# Patient Record
Sex: Male | Born: 1990
Health system: Southern US, Community
[De-identification: ages and names within clinical notes are randomized; demographics above are authoritative.]

## PROBLEM LIST (undated history)

## (undated) DIAGNOSIS — G4733 Obstructive sleep apnea (adult) (pediatric): Secondary | ICD-10-CM

## (undated) DIAGNOSIS — I5033 Acute on chronic diastolic (congestive) heart failure: Secondary | ICD-10-CM

## (undated) DIAGNOSIS — R1110 Vomiting, unspecified: Secondary | ICD-10-CM

## (undated) DIAGNOSIS — I4729 Other ventricular tachycardia: Secondary | ICD-10-CM

## (undated) DIAGNOSIS — R000 Tachycardia, unspecified: Secondary | ICD-10-CM

## (undated) HISTORY — DX: Other ventricular tachycardia: I47.29

## (undated) HISTORY — PX: OTHER SURGICAL HISTORY: SHX169

## (undated) HISTORY — DX: Acute on chronic diastolic (congestive) heart failure: I50.33

## (undated) HISTORY — DX: Tachycardia, unspecified: R00.0

---

## 2019-08-31 ENCOUNTER — Other Ambulatory Visit: Payer: Self-pay | Admitting: Nurse Practitioner

## 2019-08-31 DIAGNOSIS — E66813 Obesity, class 3: Secondary | ICD-10-CM

## 2019-08-31 DIAGNOSIS — Z6841 Body Mass Index (BMI) 40.0 and over, adult: Secondary | ICD-10-CM

## 2019-08-31 DIAGNOSIS — U071 COVID-19: Secondary | ICD-10-CM

## 2019-08-31 NOTE — Progress Notes (Signed)
  I connected by phone with Zachary Fletcher on 08/31/2019 at 2:43 PM to discuss the potential use of an new treatment for mild to moderate COVID-19 viral infection in non-hospitalized patients.  This patient is a 29 y.o. male that meets the FDA criteria for Emergency Use Authorization of bamlanivimab/etesevimab or casirivimab/imdevimab.  Has a (+) direct SARS-CoV-2 viral test result  Has mild or moderate COVID-19   Is NOT hospitalized due to COVID-19  Is within 10 days of symptom onset  Has at least one of the high risk factor(s) for progression to severe COVID-19 and/or hospitalization as defined in EUA.  Specific high risk criteria : BMI > 25   I have spoken and communicated the following to the patient or parent/caregiver:  1. FDA has authorized the emergency use of bamlanivimab/etesevimab and casirivimab\imdevimab for the treatment of mild to moderate COVID-19 in adults and pediatric patients with positive results of direct SARS-CoV-2 viral testing who are 46 years of age and older weighing at least 40 kg, and who are at high risk for progressing to severe COVID-19 and/or hospitalization.  2. The significant known and potential risks and benefits of bamlanivimab/etesevimab and casirivimab\imdevimab, and the extent to which such potential risks and benefits are unknown.  3. Information on available alternative treatments and the risks and benefits of those alternatives, including clinical trials.  4. Patients treated with bamlanivimab/etesevimab and casirivimab\imdevimab should continue to self-isolate and use infection control measures (e.g., wear mask, isolate, social distance, avoid sharing personal items, clean and disinfect "high touch" surfaces, and frequent handwashing) according to CDC guidelines.   5. The patient or parent/caregiver has the option to accept or refuse bamlanivimab/etesevimab or casirivimab\imdevimab .  After reviewing this information with the patient, The  patient agreed to proceed with receiving the bamlanimivab infusion and will be provided a copy of the Fact sheet prior to receiving the infusion.[name] 08/31/2019 2:43 PM

## 2019-09-01 ENCOUNTER — Ambulatory Visit (HOSPITAL_COMMUNITY)
Admission: RE | Admit: 2019-09-01 | Discharge: 2019-09-01 | Disposition: A | Discharge status: Home / Self Care | Payer: HRSA Program | Source: Ambulatory Visit | Attending: Pulmonary Disease | Admitting: Pulmonary Disease

## 2019-09-01 DIAGNOSIS — U071 COVID-19: Secondary | ICD-10-CM | POA: Diagnosis present

## 2019-09-01 DIAGNOSIS — Z6841 Body Mass Index (BMI) 40.0 and over, adult: Secondary | ICD-10-CM | POA: Insufficient documentation

## 2019-09-01 MED ORDER — SODIUM CHLORIDE 0.9 % IV SOLN
Freq: Once | INTRAVENOUS | Status: AC
  Filled 2019-09-01: qty 20

## 2019-09-01 MED ORDER — EPINEPHRINE 0.3 MG/0.3ML IJ SOAJ: 0.3000 mg | Freq: Once | INTRAMUSCULAR | Status: DC | PRN

## 2019-09-01 MED ORDER — METHYLPREDNISOLONE SODIUM SUCC 125 MG IJ SOLR: 125.0000 mg | Freq: Once | INTRAMUSCULAR | Status: DC | PRN

## 2019-09-01 MED ORDER — SODIUM CHLORIDE 0.9 % IV SOLN: INTRAVENOUS | Status: DC | PRN

## 2019-09-01 MED ORDER — ALBUTEROL SULFATE HFA 108 (90 BASE) MCG/ACT IN AERS: 2.0000 | INHALATION_SPRAY | Freq: Once | RESPIRATORY_TRACT | Status: DC | PRN

## 2019-09-01 MED ORDER — FAMOTIDINE IN NACL 20-0.9 MG/50ML-% IV SOLN: 20.0000 mg | Freq: Once | INTRAVENOUS | Status: DC | PRN

## 2019-09-01 MED ORDER — DIPHENHYDRAMINE HCL 50 MG/ML IJ SOLN: 50.0000 mg | Freq: Once | INTRAMUSCULAR | Status: DC | PRN

## 2019-09-01 NOTE — Discharge Instructions (Signed)
10 Things You Can Do to Manage Your COVID-19 Symptoms at Home If you have possible or confirmed COVID-19: 1. Stay home from work and school. And stay away from other public places. If you must go out, avoid using any kind of public transportation, ridesharing, or taxis. 2. Monitor your symptoms carefully. If your symptoms get worse, call your healthcare provider immediately. 3. Get rest and stay hydrated. 4. If you have a medical appointment, call the healthcare provider ahead of time and tell them that you have or may have COVID-19. 5. For medical emergencies, call 911 and notify the dispatch personnel that you have or may have COVID-19. 6. Cover your cough and sneezes with a tissue or use the inside of your elbow. 7. Wash your hands often with soap and water for at least 20 seconds or clean your hands with an alcohol-based hand sanitizer that contains at least 60% alcohol. 8. As much as possible, stay in a specific room and away from other people in your home. Also, you should use a separate bathroom, if available. If you need to be around other people in or outside of the home, wear a mask. 9. Avoid sharing personal items with other people in your household, like dishes, towels, and bedding. 10. Clean all surfaces that are touched often, like counters, tabletops, and doorknobs. Use household cleaning sprays or wipes according to the label instructions. cdc.gov/coronavirus 09/08/2018 This information is not intended to replace advice given to you by your health care provider. Make sure you discuss any questions you have with your health care provider. Document Revised: 02/10/2019 Document Reviewed: 02/10/2019 Elsevier Patient Education  2020 Elsevier Inc.  

## 2019-09-01 NOTE — Progress Notes (Addendum)
°  Diagnosis: COVID-19  Physician: Dr. Joya Gaskins  Procedure: [facility] Med: bamlanivimab\etesevimab infusion - Provided patient with bamlanimivab\etesevimab fact sheet for patients, parents and caregivers prior to infusion.  Complications: No immediate complications noted.  Discharge: Discharged home   Zachary Fletcher 09/01/2019

## 2019-11-29 ENCOUNTER — Emergency Department (INDEPENDENT_AMBULATORY_CARE_PROVIDER_SITE_OTHER): Payer: 59

## 2019-11-29 ENCOUNTER — Other Ambulatory Visit: Payer: Self-pay

## 2019-11-29 ENCOUNTER — Emergency Department
Admission: EM | Admit: 2019-11-29 | Discharge: 2019-11-29 | Disposition: A | Discharge status: Home / Self Care | Payer: 59 | Source: Home / Self Care | Attending: Family Medicine | Admitting: Family Medicine

## 2019-11-29 DIAGNOSIS — R05 Cough: Secondary | ICD-10-CM | POA: Diagnosis not present

## 2019-11-29 DIAGNOSIS — R002 Palpitations: Secondary | ICD-10-CM | POA: Diagnosis not present

## 2019-11-29 DIAGNOSIS — R0600 Dyspnea, unspecified: Secondary | ICD-10-CM | POA: Diagnosis not present

## 2019-11-29 DIAGNOSIS — R053 Chronic cough: Secondary | ICD-10-CM

## 2019-11-29 DIAGNOSIS — I4581 Long QT syndrome: Secondary | ICD-10-CM

## 2019-11-29 DIAGNOSIS — R0609 Other forms of dyspnea: Secondary | ICD-10-CM

## 2019-11-29 DIAGNOSIS — R030 Elevated blood-pressure reading, without diagnosis of hypertension: Secondary | ICD-10-CM

## 2019-11-29 HISTORY — DX: Obstructive sleep apnea (adult) (pediatric): G47.33

## 2019-11-29 LAB — POCT URINALYSIS DIP (MANUAL ENTRY)
Bilirubin, UA: NEGATIVE
Blood, UA: NEGATIVE
Glucose, UA: NEGATIVE mg/dL
Ketones, POC UA: NEGATIVE mg/dL
Leukocytes, UA: NEGATIVE
Nitrite, UA: NEGATIVE
Protein Ur, POC: NEGATIVE mg/dL
Spec Grav, UA: 1.02 (ref 1.010–1.025)
Urobilinogen, UA: 0.2 E.U./dL
pH, UA: 6 (ref 5.0–8.0)

## 2019-11-29 LAB — COMPLETE METABOLIC PANEL WITH GFR
AG Ratio: 1.2 (calc) (ref 1.0–2.5)
ALT: 18 U/L (ref 9–46)
AST: 14 U/L (ref 10–40)
Albumin: 3.7 g/dL (ref 3.6–5.1)
Alkaline phosphatase (APISO): 61 U/L (ref 36–130)
BUN: 12 mg/dL (ref 7–25)
CO2: 28 mmol/L (ref 20–32)
Calcium: 9 mg/dL (ref 8.6–10.3)
Chloride: 102 mmol/L (ref 98–110)
Creat: 0.94 mg/dL (ref 0.60–1.35)
GFR, Est African American: 126 mL/min/{1.73_m2} (ref >=60)
GFR, Est Non African American: 109 mL/min/{1.73_m2} (ref >=60)
Globulin: 3 g/dL (calc) (ref 1.9–3.7)
Glucose, Bld: 94 mg/dL (ref 65–99)
Potassium: 4.2 mmol/L (ref 3.5–5.3)
Sodium: 139 mmol/L (ref 135–146)
Total Bilirubin: 0.7 mg/dL (ref 0.2–1.2)
Total Protein: 6.7 g/dL (ref 6.1–8.1)

## 2019-11-29 LAB — CBC WITH DIFFERENTIAL/PLATELET
Absolute Monocytes: 636 cells/uL (ref 200–950)
Basophils Absolute: 26 cells/uL (ref 0–200)
Basophils Relative: 0.3 %
Eosinophils Absolute: 69 cells/uL (ref 15–500)
Eosinophils Relative: 0.8 %
HCT: 44.5 % (ref 38.5–50.0)
Hemoglobin: 15.2 g/dL (ref 13.2–17.1)
Lymphs Abs: 1634 cells/uL (ref 850–3900)
MCH: 29.9 pg (ref 27.0–33.0)
MCHC: 34.2 g/dL (ref 32.0–36.0)
MCV: 87.4 fL (ref 80.0–100.0)
MPV: 11.6 fL (ref 7.5–12.5)
Monocytes Relative: 7.4 %
Neutro Abs: 6235 cells/uL (ref 1500–7800)
Neutrophils Relative %: 72.5 %
Platelets: 208 10*3/uL (ref 140–400)
RBC: 5.09 10*6/uL (ref 4.20–5.80)
RDW: 13.2 % (ref 11.0–15.0)
Total Lymphocyte: 19 %
WBC: 8.6 10*3/uL (ref 3.8–10.8)

## 2019-11-29 NOTE — ED Provider Notes (Signed)
[facility]    CSN: [id number] Arrival date & time: 11/29/19  1324      History   Chief Complaint Chief Complaint  Patient presents with  . Cough    w/ wheezing  . Palpitations    HPI Zachary Fletcher is a 29 y.o. male.   Patient presents with several complaints: 1)  He has had intermittent recurring palpitations, mostly at night, and feels light-headed when they occur.  The palpitations last about 20 to 30 seconds, resolving spontaneously.  He denies chest pain. 2)  He has had a persistent cough for at least 6 months.  He underwent a polysomnogram on 09/14/19, and consequently was started on Bipap for sleep apnea.   He has difficulty sleeping with the Bipap.   He admits that he has gained weight during the past year, from 380 pounds to 437 pounds. 3)  He reports that his job is stressful, resulting in anxiety that seems to make his symptoms worse.  His symptoms decrease when he is away from work.  The history is provided by the patient.    Past Medical History:  Diagnosis Date  . Sleep apnea, obstructive     There are no problems to display for this patient.   History reviewed. No pertinent surgical history.     Home Medications    Prior to Admission medications   Not on File    Family History Family History  Problem Relation Age of Onset  . Sleep apnea Father   . Diabetes Other     Social History Social History   Tobacco Use  . Smoking status: Never Smoker  . Smokeless tobacco: Never Used  Vaping Use  . Vaping Use: Never used  Substance Use Topics  . Alcohol use: Not Currently  . Drug use: Not on file     Allergies   Patient has no known allergies.   Review of Systems Review of Systems No sore throat + cough + light-headedness during cough No pleuritic pain No chest pain + palpitations, worse at night No wheezing No nasal congestion No post-nasal drainage No sinus pain/pressure No itchy/red eyes No earache No  hemoptysis + SOB No fever/chills No nausea No vomiting No abdominal pain No diarrhea No urinary symptoms No skin rash + fatigue No myalgias + anxiety + headache Used OTC meds without relief   Physical Exam Triage Vital Signs ED Triage Vitals  Enc Vitals Group     BP 11/29/19 0906 (!) 169/104     Pulse Rate 11/29/19 0906 93     Resp 11/29/19 0906 19     Temp 11/29/19 0906 98.1 F (36.7 C)     Temp Source 11/29/19 0906 Oral     SpO2 11/29/19 0906 98 %     Weight 11/29/19 0909 (!) 437 lb 8 oz (198.4 kg)     Height 11/29/19 0909 6\' 2"  (1.88 m)     Head Circumference --      Peak Flow --      Pain Score 11/29/19 0908 0     Pain Loc --      Pain Edu? --      Excl. in [place]? --    No data found.  Updated Vital Signs BP (!) 142/87 (BP Location: Left Arm)   Pulse 93   Temp 98.1 F (36.7 C) (Oral)   Resp 19   Ht 6\' 2"  (1.88 m)   Wt (!) 198.4 kg   SpO2 98%   BMI  56.17 kg/m   Visual Acuity Right Eye Distance:   Left Eye Distance:   Bilateral Distance:    Right Eye Near:   Left Eye Near:    Bilateral Near:     Physical Exam Nursing notes and Vital Signs reviewed. Appearance:  Patient appears stated age, alert and oriented, and in no acute distress.  He is morbidly obese. Eyes:  Pupils are equal, round, and reactive to light and accomodation.  Extraocular movement is intact.  Conjunctivae are not inflamed.  Fundi benign.  Ears:  Canals normal.  Tympanic membranes normal.  Nose:  Normal turbinates.  No sinus tenderness.  Neck:  Supple.  No adenopathy or thyromegaly.  Carotids have normal upstrokes.   Lungs:  Clear to auscultation.  Breath sounds are equal.  Moving air well. Heart:  Regular rate and rhythm without murmurs, rubs, or gallops.  Abdomen:  Protuberant, nontender without masses or hepatosplenomegaly.  Bowel sounds are present.  No CVA or flank tenderness.  Extremities:  No edema.  Skin:  No rash present.   UC Treatments / Results  Labs (all labs  ordered are listed, but only abnormal results are displayed) Labs Reviewed  CBC WITH DIFFERENTIAL/PLATELET  COMPLETE METABOLIC PANEL WITH GFR  POCT URINALYSIS DIP (MANUAL ENTRY)  Ref Range & Units 3 d ago  Color, UA yellow yellow   Clarity, UA clear clear   Glucose, UA negative mg/dL negative   Bilirubin, UA negative negative   Ketones, POC UA negative mg/dL negative   Spec Grav, UA 1.010 - 1.025 1.020   Blood, UA negative negative   pH, UA 5.0 - 8.0 6.0   Protein Ur, POC negative mg/dL negative   Urobilinogen, UA 0.2 or 1.0 E.U./dL 0.2   Nitrite, UA Negative Negative   Leukocytes, UA Negative Negative   Resulting Agency  [facility]      Specimen Collected: 11/29/19 10:17 Last Resulted: 11/29/19 10:18           EKG  Rate:  86 BPM PR:  152 msec QT:  404 msec QTcH:  483 msec QRSD:  98 msec QRS axis:  104 degrees Interpretation:  Prolonged QT; normal sinus rhythm;  no acute changes  Radiology No results found.  Procedures Procedures (including critical care time)  Medications Ordered in UC Medications - No data to display  Initial Impression / Assessment and Plan / UC Course  I have reviewed the triage vital signs and the nursing notes.  Pertinent labs & imaging results that were available during my care of the patient were reviewed by me and considered in my medical decision making (see chart for details).    CBC, CMP pending.  POC urinalysis negative for proteinuria. Suspect GERD contributing to his cough. Recommend cardiologist evaluation as soon as possible for long QT syndrome.  Followup with Family Doctor for evaluation/treatment of multiple medical problems.    Final Clinical Impressions(s) / UC Diagnoses   Final diagnoses:  Palpitations  Dyspnea on exertion  Obesity, morbid (HCC)  Chronic cough  Elevated blood pressure reading without diagnosis of hypertension  Long Q-T syndrome     Discharge Instructions     Minimize strenuous activities and  stress If symptoms become significantly worse during the night or over the weekend, proceed to the local emergency room.     ED Prescriptions    None        Kandra Nicolas, MD 12/02/19 1517

## 2019-11-29 NOTE — ED Triage Notes (Signed)
Pt c/o intermittent heart palpitations, issues sleeping x 3 mos. Recently got new bipap machine for obstructive sleep apnea.

## 2019-11-29 NOTE — Discharge Instructions (Addendum)
Minimize strenuous activities and stress

## 2019-12-23 NOTE — Progress Notes (Signed)
Zachary Fendt, MD Reason for referral-palpitations  HPI: 29 year old male for evaluation of palpitations at request of Theone Murdoch MD.  Patient seen in the emergency room November 29, 2019 with complaints of palpitations and cough.  Chest x-ray showed mild cardiac enlargement.  Potassium 4.2 and hemoglobin 15.2.  QT interval also felt to be long and cardiology asked to evaluate.  Over the past 3 to 6 months patient has had intermittent palpitations described as sudden onset of heart racing.  Last several minutes and resolve spontaneously.  There is an associated mild dyspneic feeling.  No chest pain or syncope.  He otherwise has some dyspnea on exertion but no orthopnea, PND, pedal edema, exertional chest pain or history of syncope.  Cardiology now asked to evaluate.  No current outpatient medications on file.   No current facility-administered medications for this visit.    No Known Allergies   Past Medical History:  Diagnosis Date  . Sleep apnea, obstructive     Past Surgical History:  Procedure Laterality Date  . No prior surgery      Social History   Socioeconomic History  . Marital status: Married    Spouse name: Not on file  . Number of children: Not on file  . Years of education: Not on file  . Highest education level: Not on file  Occupational History  . Not on file  Tobacco Use  . Smoking status: Never Smoker  . Smokeless tobacco: Never Used  Vaping Use  . Vaping Use: Never used  Substance and Sexual Activity  . Alcohol use: Yes    Comment: Rare  . Drug use: Not on file  . Sexual activity: Not on file  Other Topics Concern  . Not on file  Social History Narrative  . Not on file   Social Determinants of Health   Financial Resource Strain:   . Difficulty of Paying Living Expenses: Not on file  Food Insecurity:   . Worried About Charity fundraiser in the Last Year: Not on file  . Ran Out of Food in the Last Year: Not on file    Transportation Needs:   . Lack of Transportation (Medical): Not on file  . Lack of Transportation (Non-Medical): Not on file  Physical Activity:   . Days of Exercise per Week: Not on file  . Minutes of Exercise per Session: Not on file  Stress:   . Feeling of Stress : Not on file  Social Connections:   . Frequency of Communication with Friends and Family: Not on file  . Frequency of Social Gatherings with Friends and Family: Not on file  . Attends Religious Services: Not on file  . Active Member of Clubs or Organizations: Not on file  . Attends Archivist Meetings: Not on file  . Marital Status: Not on file  Intimate Partner Violence:   . Fear of Current or Ex-Partner: Not on file  . Emotionally Abused: Not on file  . Physically Abused: Not on file  . Sexually Abused: Not on file    Family History  Problem Relation Age of Onset  . Sleep apnea Father   . Diabetes Other     ROS: no fevers or chills, productive cough, hemoptysis, dysphasia, odynophagia, melena, hematochezia, dysuria, hematuria, rash, seizure activity, orthopnea, PND, pedal edema, claudication. Remaining systems are negative.  Physical Exam:   Blood pressure 140/80, pulse 84, height 6\' 2"  (1.88 m), weight (!) 444 lb (201.4 kg).  General:  Well developed/morbidly obese in NAD Skin warm/dry Patient not depressed No peripheral clubbing Back-normal HEENT-normal/normal eyelids Neck supple/normal carotid upstroke bilaterally; no bruits; no JVD; no thyromegaly chest - CTA/ normal expansion CV - RRR/normal S1 and S2; no murmurs, rubs or gallops;  PMI nondisplaced Abdomen -NT/ND, no HSM, no mass, + bowel sounds, no bruit 2+ femoral pulses, no bruits Ext-trace edema, no chords, 2+ DP Neuro-grossly nonfocal  ECG -November 29, 2019-sinus rhythm, right axis deviation, right bundle branch block, prolonged QT interval.  Personally reviewed  A/P  1 palpitations-patient sounds potentially to have a  supraventricular tachycardia.  We will schedule an echocardiogram to assess LV function.  Schedule event monitor to further assess.  Further recommendations based on these results.  2 abnormal electrocardiogram-plan echocardiogram as outlined above.  3 morbid obesity-we discussed the importance of diet, exercise and weight loss.  4 obstructive sleep apnea-continue CPAP.  Kirk Ruths, MD

## 2019-12-28 ENCOUNTER — Ambulatory Visit (INDEPENDENT_AMBULATORY_CARE_PROVIDER_SITE_OTHER): Payer: 59 | Admitting: Cardiology

## 2019-12-28 ENCOUNTER — Other Ambulatory Visit: Payer: Self-pay

## 2019-12-28 ENCOUNTER — Encounter: Payer: Self-pay | Admitting: Cardiology

## 2019-12-28 ENCOUNTER — Telehealth: Payer: Self-pay | Admitting: Radiology

## 2019-12-28 VITALS — BP 140/80 | HR 84 | Ht 74.0 in | Wt >= 6400 oz

## 2019-12-28 DIAGNOSIS — R002 Palpitations: Secondary | ICD-10-CM

## 2019-12-28 NOTE — Telephone Encounter (Signed)
Enrolled patient for a 30 day [employer] Event monitor to be mailed to patients home.  

## 2019-12-28 NOTE — Patient Instructions (Signed)
  Testing/Procedures:  Your physician has requested that you have an echocardiogram. Echocardiography is a painless test that uses sound waves to create images of your heart. It provides your doctor with information about the size and shape of your heart and how well your heart's chambers and valves are working. This procedure takes approximately one hour. There are no restrictions for this procedure.[facility]-1ST FLOOR IMAGING DEPARTMENT  Your physician has recommended that you wear a 30 DAY event monitor. Event monitors are medical devices that record the heart's electrical activity. Doctors most often [place] these monitors to diagnose arrhythmias. Arrhythmias are problems with the speed or rhythm of the heartbeat. The monitor is a small, portable device. You can wear one while you do your normal daily activities. This is usually used to diagnose what is causing palpitations/syncope (passing out). WILL BE MAILED TO YOUR HOME   Follow-Up: At [facility], you and your health needs are our priority.  As part of our continuing mission to provide you with exceptional heart care, we have created designated Provider Care Teams.  These Care Teams include your primary Cardiologist (physician) and Advanced Practice Providers (APPs -  Physician Assistants and Nurse Practitioners) who all work together to provide you with the care you need, when you need it.  We recommend signing up for the patient portal called "MyChart".  Sign up information is provided on this After Visit Summary.  MyChart is used to connect with patients for Virtual Visits (Telemedicine).  Patients are able to view lab/test results, encounter notes, upcoming appointments, etc.  Non-urgent messages can be sent to your provider as well.   To learn more about what you can do with MyChart, go to [email or web address].    Your next appointment:   8 week(s)  The format for your next appointment:   In Person  Provider:   Kirk Ruths, MD

## 2020-01-05 ENCOUNTER — Encounter (INDEPENDENT_AMBULATORY_CARE_PROVIDER_SITE_OTHER): Payer: 59

## 2020-01-05 DIAGNOSIS — R002 Palpitations: Secondary | ICD-10-CM

## 2020-01-06 ENCOUNTER — Ambulatory Visit (HOSPITAL_BASED_OUTPATIENT_CLINIC_OR_DEPARTMENT_OTHER)
Admission: RE | Admit: 2020-01-06 | Discharge: 2020-01-06 | Disposition: A | Discharge status: Home / Self Care | Payer: 59 | Source: Ambulatory Visit | Attending: Cardiology | Admitting: Cardiology

## 2020-01-06 ENCOUNTER — Other Ambulatory Visit: Payer: Self-pay

## 2020-01-06 DIAGNOSIS — R002 Palpitations: Secondary | ICD-10-CM | POA: Diagnosis not present

## 2020-01-07 LAB — ECHOCARDIOGRAM COMPLETE
Area-P 1/2: 5.42 cm2
S' Lateral: 2.94 cm

## 2020-01-25 ENCOUNTER — Telehealth: Payer: Self-pay | Admitting: Cardiology

## 2020-01-25 NOTE — Telephone Encounter (Signed)
June is calling stating the patient's prior auth for the code 93229 has been denied due to lack of information. He states a letter will be received in regards to this with further information within the next 3-5 days. The reference number is [id number]. Please advise.

## 2020-01-26 NOTE — Telephone Encounter (Signed)
Spoke with Jenny Reichmann at [facility], last office note faxed to [phone].

## 2020-02-01 ENCOUNTER — Telehealth: Payer: Self-pay | Admitting: *Deleted

## 2020-02-01 MED ORDER — METOPROLOL SUCCINATE ER 25 MG PO TB24
25.0000 mg | ORAL_TABLET | Freq: Every day | ORAL | 3 refills | Status: DC
Start: 2020-02-01 — End: 2021-03-26

## 2020-02-01 NOTE — Telephone Encounter (Signed)
Spoke with pt, Aware of dr crenshaw's recommendations. New script sent to the pharmacy  

## 2020-02-01 NOTE — Telephone Encounter (Signed)
-----   Message from Lelon Perla, MD sent at 02/01/2020  4:20 PM EST ----- Add toprol 25 mg daily Kirk Ruths

## 2020-02-14 NOTE — Progress Notes (Signed)
° ° ° ° °  HPI: FU palpitations.  Patient seen in the emergency room November 29, 2019 with complaints of palpitations and cough.  Chest x-ray showed mild cardiac enlargement.  Potassium 4.2 and hemoglobin 15.2.  QT interval also felt to be long.  Echocardiogram October 2021 showed normal LV function.  Monitor November 2021 showed sinus rhythm with 32nd run of SVT and 4 beats of nonsustained ventricular tachycardia.  Toprol added.  Since last seen he denies dyspnea, chest pain or syncope.  His palpitations have improved though he has noticed some fatigue with metoprolol.  He has lost 16 pounds with his diet.  Current Outpatient Medications  Medication Sig Dispense Refill   metoprolol succinate (TOPROL XL) 25 MG 24 hr tablet Take 1 tablet (25 mg total) by mouth daily. 90 tablet 3   Multiple Vitamins-Minerals (MULTI-VITAMIN GUMMIES PO) Take by mouth.     No current facility-administered medications for this visit.     Past Medical History:  Diagnosis Date   Sleep apnea, obstructive     Past Surgical History:  Procedure Laterality Date   No prior surgery      Social History   Socioeconomic History   Marital status: Married    Spouse name: Not on file   Number of children: Not on file   Years of education: Not on file   Highest education level: Not on file  Occupational History   Not on file  Tobacco Use   Smoking status: Never Smoker   Smokeless tobacco: Never Used  Vaping Use   Vaping Use: Never used  Substance and Sexual Activity   Alcohol use: Yes    Comment: Rare   Drug use: Not on file   Sexual activity: Not on file  Other Topics Concern   Not on file  Social History Narrative   Not on file   Social Determinants of Health   Financial Resource Strain: Not on file  Food Insecurity: Not on file  Transportation Needs: Not on file  Physical Activity: Not on file  Stress: Not on file  Social Connections: Not on file  Intimate Partner Violence: Not on  file    Family History  Problem Relation Age of Onset   Sleep apnea Father    Diabetes Other     ROS: no fevers or chills, productive cough, hemoptysis, dysphasia, odynophagia, melena, hematochezia, dysuria, hematuria, rash, seizure activity, orthopnea, PND, pedal edema, claudication. Remaining systems are negative.  Physical Exam: Well-developed obese in no acute distress.  Skin is warm and dry.  HEENT is normal.  Neck is supple.  Chest is clear to auscultation with normal expansion.  Cardiovascular exam is regular rate and rhythm.  Abdominal exam nontender or distended. No masses palpated. Extremities show no edema. neuro grossly intact   A/P  1 palpitations-felt likely to be supraventricular tachycardia.  Note LV function is normal.  Continue beta-blocker.  He has noticed some fatigue.  I have asked him to take his metoprolol at night.  Can consider referral for ablation in the future if needed.  2 morbid obesity-we discussed the importance of weight loss.  He has lost 16 pounds thus far.  3 obstructive sleep apnea-continue CPAP.  Kirk Ruths, MD

## 2020-02-17 ENCOUNTER — Other Ambulatory Visit: Payer: Self-pay | Admitting: Nurse Practitioner

## 2020-02-17 DIAGNOSIS — M545 Low back pain, unspecified: Secondary | ICD-10-CM

## 2020-02-22 ENCOUNTER — Encounter: Payer: Self-pay | Admitting: Cardiology

## 2020-02-22 ENCOUNTER — Other Ambulatory Visit: Payer: Self-pay

## 2020-02-22 ENCOUNTER — Ambulatory Visit (INDEPENDENT_AMBULATORY_CARE_PROVIDER_SITE_OTHER): Payer: 59 | Admitting: Cardiology

## 2020-02-22 VITALS — BP 138/82 | HR 78 | Ht 74.0 in | Wt >= 6400 oz

## 2020-02-22 DIAGNOSIS — R002 Palpitations: Secondary | ICD-10-CM

## 2020-02-22 NOTE — Patient Instructions (Signed)

## 2020-03-14 ENCOUNTER — Ambulatory Visit: Payer: 59 | Admitting: Nurse Practitioner

## 2020-03-15 ENCOUNTER — Other Ambulatory Visit: Payer: 59

## 2020-06-08 ENCOUNTER — Ambulatory Visit: Payer: 59 | Admitting: Family Medicine

## 2020-06-12 ENCOUNTER — Other Ambulatory Visit: Payer: Self-pay

## 2020-06-13 ENCOUNTER — Ambulatory Visit: Payer: 59 | Admitting: Nurse Practitioner

## 2020-06-13 ENCOUNTER — Encounter: Payer: Self-pay | Admitting: Nurse Practitioner

## 2020-06-13 VITALS — BP 126/70 | HR 87 | Temp 97.4°F | Ht 74.0 in | Wt >= 6400 oz

## 2020-06-13 DIAGNOSIS — R1032 Left lower quadrant pain: Secondary | ICD-10-CM | POA: Diagnosis not present

## 2020-06-13 NOTE — Progress Notes (Signed)
Subjective:  Patient ID: Zachary Fletcher, male    DOB: 05-31-1990  Age: 30 y.o. MRN: [id number]  CC: Establish Care (New patient/Pt c/o nerve pain in legs since OCT. 2021. Pt states left leg is worse than right and he has some sensations in his pelvis area also. )  Groin Pain The patient's primary symptoms include pelvic pain. The patient's pertinent negatives include no genital injury, genital itching, genital lesions, penile discharge, penile pain, priapism, scrotal swelling or testicular pain. This is a new (onset 12/2019) problem. The problem occurs constantly. The problem has been unchanged. Pertinent negatives include no abdominal pain, anorexia, chest pain, chills, constipation, coughing, diarrhea, discolored urine, dysuria, fever, flank pain, frequency, headaches, hematuria, hesitancy, joint pain, joint swelling, nausea, painful intercourse, rash, shortness of breath, sore throat, urgency, urinary retention or vomiting. There is no reported injury. Exacerbated by: standing more than 3hrs. He has tried OTC analgesics for the symptoms. The treatment provided mild relief. He is sexually active. He never uses condoms. No, his partner does not have an STD. There is no history of BPH, chlamydia, erectile dysfunction, a femoral hernia, gonorrhea, an inguinal hernia, kidney stones, prostatitis, syphilis or varicocele.  evaluated by urgent care clinic and primary care practice within last 51months for same symptoms. He states MRI lumbar spine was declined by his insurance. Normal urinalysis and CBC  Reviewed past Medical, Social and Family history today.  Outpatient Medications Prior to Visit  Medication Sig Dispense Refill  . metoprolol succinate (TOPROL XL) 25 MG 24 hr tablet Take 1 tablet (25 mg total) by mouth daily. 90 tablet 3  . Multiple Vitamins-Minerals (MULTI-VITAMIN GUMMIES PO) Take by mouth.     No facility-administered medications prior to visit.    ROS See HPI  Objective:  BP  126/70 (BP Location: Left Arm, Patient Position: Sitting, Cuff Size: Large)   Pulse 87   Temp (!) 97.4 F (36.3 C) (Temporal)   Ht 6\' 2"  (1.88 m)   Wt (!) 435 lb (197.3 kg)   SpO2 98%   BMI 55.85 kg/m   Physical Exam Vitals reviewed. Exam conducted with a chaperone present.  Constitutional:      General: He is not in acute distress.    Appearance: He is obese.  Genitourinary:    Testes: Normal.        Right: Mass, tenderness, swelling, testicular hydrocele or varicocele not present.        Left: Mass, tenderness, swelling, testicular hydrocele or varicocele not present.  Lymphadenopathy:     Lower Body: Left inguinal adenopathy present.  Skin:    General: Skin is warm and dry.     Findings: No erythema.  Neurological:     Mental Status: He is alert.  Psychiatric:        Mood and Affect: Mood normal.        Behavior: Behavior normal.        Thought Content: Thought content normal.    Assessment & Plan:  This visit occurred during the SARS-CoV-2 public health emergency.  Safety protocols were in place, including screening questions prior to the visit, additional usage of staff PPE, and extensive cleaning of exam room while observing appropriate contact time as indicated for disinfecting solutions.   Santiago was seen today for establish care.  Diagnoses and all orders for this visit:  Groin pain, left -     US Pelvis Limited; Future -     US SCROTUM; Future -     US  SCROTUM W/DOPPLER; Future   Problem List Items Addressed This Visit   None   Visit Diagnoses    Groin pain, left    -  Primary   Relevant Orders   US Pelvis Limited   US SCROTUM   US SCROTUM W/DOPPLER      Follow-up: Return in about 4 weeks (around 07/11/2020) for CPE (fasting).  [name], NP

## 2020-06-13 NOTE — Patient Instructions (Signed)
You will be contacted to schedule an appt for [place].

## 2020-06-14 ENCOUNTER — Other Ambulatory Visit: Payer: Self-pay | Admitting: Nurse Practitioner

## 2020-06-14 ENCOUNTER — Other Ambulatory Visit: Payer: 59

## 2020-06-14 ENCOUNTER — Ambulatory Visit
Admission: RE | Admit: 2020-06-14 | Discharge: 2020-06-14 | Disposition: A | Discharge status: Home / Self Care | Payer: 59 | Source: Ambulatory Visit | Attending: Nurse Practitioner | Admitting: Nurse Practitioner

## 2020-06-14 ENCOUNTER — Other Ambulatory Visit: Payer: Self-pay

## 2020-06-14 DIAGNOSIS — R1032 Left lower quadrant pain: Secondary | ICD-10-CM

## 2020-06-14 DIAGNOSIS — M5416 Radiculopathy, lumbar region: Secondary | ICD-10-CM

## 2020-06-19 ENCOUNTER — Other Ambulatory Visit: Payer: Self-pay

## 2020-06-19 ENCOUNTER — Other Ambulatory Visit: Payer: 59

## 2020-06-19 ENCOUNTER — Ambulatory Visit (INDEPENDENT_AMBULATORY_CARE_PROVIDER_SITE_OTHER): Payer: 59

## 2020-06-19 DIAGNOSIS — R1032 Left lower quadrant pain: Secondary | ICD-10-CM

## 2020-06-19 DIAGNOSIS — M5416 Radiculopathy, lumbar region: Secondary | ICD-10-CM | POA: Diagnosis not present

## 2020-06-20 ENCOUNTER — Other Ambulatory Visit: Payer: Self-pay | Admitting: Nurse Practitioner

## 2020-06-20 DIAGNOSIS — R1032 Left lower quadrant pain: Secondary | ICD-10-CM

## 2020-06-26 ENCOUNTER — Ambulatory Visit: Payer: 59 | Admitting: Medical

## 2020-06-29 ENCOUNTER — Telehealth: Payer: Self-pay

## 2020-06-29 DIAGNOSIS — R1032 Left lower quadrant pain: Secondary | ICD-10-CM

## 2020-06-29 DIAGNOSIS — M5416 Radiculopathy, lumbar region: Secondary | ICD-10-CM

## 2020-06-29 MED ORDER — GABAPENTIN 100 MG PO CAPS: ORAL_CAPSULE | ORAL | 3 refills | Status: DC

## 2020-06-29 MED ORDER — MELOXICAM 7.5 MG PO TABS: 7.5000 mg | ORAL_TABLET | Freq: Every day | ORAL | 0 refills | Status: DC

## 2020-06-29 NOTE — Telephone Encounter (Signed)
Patient notified and verbalized understanding. Pt states he would like medication to help with pain and he will return the call to PT to schedule a appointment.

## 2020-06-29 NOTE — Telephone Encounter (Signed)
Pt is wanting to discuss option after findings on [place] and Xray.  Call back number is [phone]

## 2020-07-16 ENCOUNTER — Ambulatory Visit: Payer: 59 | Attending: Nurse Practitioner | Admitting: Physical Therapy

## 2020-07-16 DIAGNOSIS — M62838 Other muscle spasm: Secondary | ICD-10-CM | POA: Insufficient documentation

## 2020-07-16 DIAGNOSIS — R262 Difficulty in walking, not elsewhere classified: Secondary | ICD-10-CM | POA: Insufficient documentation

## 2020-07-16 DIAGNOSIS — M6281 Muscle weakness (generalized): Secondary | ICD-10-CM | POA: Insufficient documentation

## 2020-07-16 DIAGNOSIS — R29898 Other symptoms and signs involving the musculoskeletal system: Secondary | ICD-10-CM | POA: Insufficient documentation

## 2020-07-17 ENCOUNTER — Encounter: Payer: Self-pay | Admitting: Physical Therapy

## 2020-07-17 ENCOUNTER — Ambulatory Visit: Payer: 59 | Admitting: Physical Therapy

## 2020-07-17 ENCOUNTER — Other Ambulatory Visit: Payer: Self-pay

## 2020-07-17 DIAGNOSIS — R262 Difficulty in walking, not elsewhere classified: Secondary | ICD-10-CM | POA: Diagnosis present

## 2020-07-17 DIAGNOSIS — M6281 Muscle weakness (generalized): Secondary | ICD-10-CM | POA: Diagnosis present

## 2020-07-17 DIAGNOSIS — M62838 Other muscle spasm: Secondary | ICD-10-CM

## 2020-07-17 DIAGNOSIS — R29898 Other symptoms and signs involving the musculoskeletal system: Secondary | ICD-10-CM | POA: Diagnosis not present

## 2020-07-17 NOTE — Therapy (Signed)
[facility] [address]  [place], [place], [address] Phone: [phone]   Fax:  [phone]  Physical Therapy Evaluation  Patient Details  Name: Zachary Fletcher MRN: [id number] Date of Birth: 1990-09-08 Referring Provider (PT): Flossie Buffy, NP   Encounter Date: 07/17/2020   PT End of Session - 07/17/20 1538    Visit Number 1    Number of Visits 8    Date for PT Re-Evaluation 09/11/20    Authorization Type [employer] - $100 copay    PT Start Time 1538   Pt arrived late   PT Stop Time 1625    PT Time Calculation (min) 47 min    Activity Tolerance Patient tolerated treatment well    Behavior During Therapy [facility] for tasks assessed/performed           Past Medical History:  Diagnosis Date  . Sleep apnea, obstructive     Past Surgical History:  Procedure Laterality Date  . No prior surgery      There were no vitals filed for this visit.    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 Pain would initially be stinging & burning after being on his feet for ~1 hr. He could get some relief with stretching but pain would return w/in an hour. No recollection of any trauma or fall at the time.    Limitations Standing    How long can you stand comfortably? 2.5 hrs    Diagnostic tests Lumbar x-rays and pelvic/scrotal US all negative.    Patient Stated Goals "be able to work a semi-physical 8-hr job like I use to"    Currently in Pain? No/denies    Pain Location Pelvis   pubic area   Pain Orientation Left    Pain Descriptors / Indicators Burning   stinging   Pain Type Chronic pain    Pain Radiating Towards occasional numbness or tingling in L pubic area/groin after sitting on the toilet for a while    Pain Onset More than a month ago   Oct 2021   Aggravating Factors  prolonged  standing; hip adductor stretch    Pain Relieving Factors standing figure-4 & ITB/QL stretches    Effect of Pain on Daily Activities limited standing tolerance preventing him from working full shifts              [facility] PT Assessment - 07/17/20 1538      Assessment   Medical Diagnosis L unilateral groin pain    Referring Provider (PT) Flossie Buffy, NP    Onset Date/Surgical Date --   Oct 2021   Next MD Visit 07/27/20 - physical    Prior Therapy none      Precautions   Precautions None      Restrictions   Weight Bearing Restrictions No      Balance Screen   Has the patient fallen in the past 6 months No    Has the patient had a decrease in activity level because of a fear of falling?  No    Is the patient reluctant to leave their home because of a fear of falling?  No      Home Environment   Living Environment Private residence    Living Arrangements Spouse/significant other    Type of Home Apartment  Home Access Level entry      Prior Function   Level of Independence Independent    Vocation Unemployed    Leisure household chores; walking 30-60 min 5x/wk for weight loss      Cognition   Overall Cognitive Status Within Functional Limits for tasks assessed      ROM / Strength   AROM / PROM / Strength AROM;Strength      AROM   AROM Assessment Site Lumbar;Hip    Lumbar Flexion hands to lower shins    Lumbar Extension WFL    Lumbar - Right Side Bend WFL    Lumbar - Left Side Bend WFL    Lumbar - Right Rotation WFL    Lumbar - Left Rotation [facility]      Strength   Strength Assessment Site Hip;Knee    Right/Left Hip Right;Left    Right Hip Flexion 5/5    Right Hip Extension 4+/5    Right Hip External Rotation  4/5    Right Hip Internal Rotation 4+/5    Right Hip ABduction 5/5    Right Hip ADduction 4/5    Left Hip Flexion 4+/5    Left Hip Extension 4/5    Left Hip External Rotation 4/5    Left Hip Internal Rotation 4+/5    Left Hip ABduction 4+/5    Left  Hip ADduction 4-/5    Right/Left Knee Right;Left    Right Knee Flexion 5/5    Right Knee Extension 5/5    Left Knee Flexion 4+/5    Left Knee Extension 5/5      Flexibility   Soft Tissue Assessment /Muscle Length yes    Hamstrings mod tight L>R    Quadriceps mod tight L>R hip flexors and quads    ITB mod tight L>R    Piriformis mild/mod tight B      Palpation   SI assessment  apparent L anterior innominate rotation of pelvis on sacrum resulting in functional shortening of L LE    Palpation comment ttp over pubic symphysis and proximal L hip adductors                      Objective measurements completed on examination: See above findings.       [facility] PT Treatment/Exercise - 07/17/20 1538      Manual Therapy   Manual Therapy Muscle Energy Technique    Muscle Energy Technique MET for correction of apparent L anterior innominate rotation of pelvis on sacrum followed by pelvic shotgun - no cavitation heard/felt but alignment appeared normalized following MET                  PT Education - 07/17/20 1620    Education Details PT eval findings and anticipated POC    Person(s) Educated Patient    Methods Explanation;Demonstration;Handout    Comprehension Verbalized understanding;Need further instruction               PT Long Term Goals - 07/17/20 1625      PT LONG TERM GOAL #1   Title Patient will be independent with ongoing/advanced HEP for self-management at home    Status New    Target Date 09/11/20      PT LONG TERM GOAL #2   Title Decrease L groin/pubic pain by >/= 50-75% with standing to allow patient to be able to return to working    Status New    Target Date 09/11/20  PT LONG TERM GOAL #3   Title Patient will maintain neutral SIJ alignment for >/= 2 weeks w/o need for manual correction    Status New    Target Date 09/11/20      PT LONG TERM GOAL #4   Title Patient will demonstrate improved B proximal LE strength to >/=  4+/5 for improved stability and ease of mobility    Status New    Target Date 09/11/20      PT LONG TERM GOAL #5   Title Patient to report ability to perform ADLs, household, and work-related tasks without increased pain    Status New    Target Date 09/11/20                  Plan - 07/17/20 1625    Clinical Impression Statement Zachary Fletcher is a 30 y/o male who presents to OP for chronic L unilateral groin/pubic pain. Pain originated in October 2021 w/o known MOI but roughly coincided with a job transition where he went from a job where he was mostly sitting to working on his feet at [employer]. Pain localized to L groin and pubic symphysis and aggravated by prolonged standing which prevents him from working currently. Deficits include pain, decreased proximal flexibility, mild proximal weakness, and an apparent L anterior innominate rotation of pelvis on sacrum resulting in functional shortening of L LE which was reducible with MET. Claude will benefit from skilled PT to address above deficits to reduce myofascial pain and tightness and improve flexibility and core/proximal LE strength to decrease L pubic/groin pain and allow for return to work and increased participation in desired activities with decreased pain interference.    Personal Factors and Comorbidities Time since onset of injury/illness/exacerbation;Past/Current Experience;Comorbidity 2;Fitness    Comorbidities Morbid obesity, OSA    Examination-Activity Limitations Stand;Sit;Locomotion Level    Examination-Participation Restrictions Occupation;Community Activity;Yard Work;Shop;Cleaning    Stability/Clinical Decision Making Stable/Uncomplicated    Clinical Decision Making Low    Rehab Potential Good    PT Frequency 1x / week   1x/wk to biweekly d/t high copay   PT Duration 8 weeks    PT Treatment/Interventions ADLs/Self Care Home Management;Cryotherapy;Electrical Stimulation;Iontophoresis 48m/ml  Dexamethasone;Ultrasound;Therapeutic activities;Therapeutic exercise;Neuromuscular re-education;Patient/family education;Manual techniques;Passive range of motion;Dry needling    PT Next Visit Plan Reassess SIJ alignment; create initial HEP for proximal flexiblity and pelvic stabilization    PT Home Exercise Plan [email or web address]   Consulted and Agree with Plan of Care Patient           Patient will benefit from skilled therapeutic intervention in order to improve the following deficits and impairments:  Decreased activity tolerance,Decreased endurance,Decreased mobility,Decreased range of motion,Decreased strength,Difficulty walking,Increased fascial restricitons,Increased muscle spasms,Impaired perceived functional ability,Impaired flexibility,Improper body mechanics,Postural dysfunction,Pain,Obesity  Visit Diagnosis: Other symptoms and signs involving the musculoskeletal system  Other muscle spasm  Muscle weakness (generalized)  Difficulty in walking, not elsewhere classified     Problem List There are no problems to display for this patient.   JPercival Spanish PT, MPT 07/17/2020, 6:40 PM  [facility] [place] [address]Phone: [phone]  Fax:  [phone] Name: TRoshan RobackMRN: [id number] of Birth: [birth year]

## 2020-07-17 NOTE — Patient Instructions (Signed)
Access Code: [email or web address] URL: [email or web address] Date: 07/17/2020 Prepared by: Annie Paras  Patient Education Sacroiliac Joint Dysfunction

## 2020-07-25 ENCOUNTER — Ambulatory Visit: Payer: 59 | Admitting: Physical Therapy

## 2020-07-26 ENCOUNTER — Other Ambulatory Visit: Payer: Self-pay

## 2020-07-26 ENCOUNTER — Encounter: Payer: Self-pay | Admitting: Physical Therapy

## 2020-07-26 ENCOUNTER — Ambulatory Visit: Payer: 59 | Admitting: Physical Therapy

## 2020-07-26 DIAGNOSIS — M62838 Other muscle spasm: Secondary | ICD-10-CM

## 2020-07-26 DIAGNOSIS — R29898 Other symptoms and signs involving the musculoskeletal system: Secondary | ICD-10-CM

## 2020-07-26 DIAGNOSIS — R262 Difficulty in walking, not elsewhere classified: Secondary | ICD-10-CM

## 2020-07-26 DIAGNOSIS — M6281 Muscle weakness (generalized): Secondary | ICD-10-CM

## 2020-07-26 NOTE — Therapy (Addendum)
[facility] [address]  [place], [place], [address] Phone: [phone]   Fax:  [phone]  Physical Therapy Treatment / Discharge Summary  Patient Details  Name: Zachary Fletcher MRN: [id number] Date of Birth: [birth year] Referring Provider (PT): Flossie Buffy, NP   Encounter Date: 07/26/2020   PT End of Session - 07/26/20 0807     Visit Number 2    Number of Visits 8    Date for PT Re-Evaluation 09/11/20    Authorization Type [employer] - $100 copay    PT Start Time 0807   Pt arrived late   PT Stop Time 0847    PT Time Calculation (min) 40 min    Activity Tolerance Patient tolerated treatment well    Behavior During Therapy [facility] for tasks assessed/performed             Past Medical History:  Diagnosis Date   Sleep apnea, obstructive     Past Surgical History:  Procedure Laterality Date   No prior surgery      There were no vitals filed for this visit.   Subjective Assessment - 07/26/20 0809     Subjective Pt denies pain todya but notes some discomfort in L lateral knee. Pt reports he was able to stand through almost a full work shift (4.5 hrs) w/o pain yesterday.    Patient Stated Goals "be able to work a semi-physical 8-hr job like I use to"    Currently in Pain? No/denies                               [facility] Adult PT Treatment/Exercise - 07/26/20 0807       Exercises   Exercises Lumbar      Lumbar Exercises: Stretches   Passive Hamstring Stretch Left;2 reps;30 seconds    Passive Hamstring Stretch Limitations supine with strap & seated hip hinge    Hip Flexor Stretch Left;2 reps;30 seconds    Hip Flexor Stretch Limitations mod thomas with strap & seated lunge position over edge of chair    ITB Stretch Left;3 reps;30 seconds    ITB Stretch Limitations supine with strap & standing lateral lean/wall sag    Piriformis Stretch Left;1 rep;30 seconds    Piriformis  Stretch Limitations supine KTOS    Figure 4 Stretch 2 reps;30 seconds;Seated;With overpressure    Figure 4 Stretch Limitations side sitting figure-4 + hip hinge    Other Lumbar Stretch Exercise L hip adductor stretches x 30 sec: supine with strap, supine frog leg, seated lateral lunge      Lumbar Exercises: Supine   Clam 10 reps;5 seconds    Clam Limitations green TB    Bridge 10 reps;5 seconds    Bridge Limitations + green TB hip ABD isometric    Other Supine Lumbar Exercises B hooklying hip ABD isometric 10 x 5"                    PT Education - 07/26/20 0845     Education Details Initial HEP - Access Code: [email or web address] - Pt provided with multiple alternatives for proximal LE stretches as well as basic lumbopelvic strengthening to promote maintenance of neutral pelvis    Person(s) Educated Patient    Methods Explanation;Demonstration;Verbal cues;Tactile cues;Handout    Comprehension Verbalized understanding;Verbal cues required;Tactile cues required;Returned demonstration  PT Long Term Goals - 07/26/20 0846       PT LONG TERM GOAL #1   Title Patient will be independent with ongoing/advanced HEP for self-management at home    Status Partially Met   07/26/20 - HEP provided with good return demonstration   Target Date 09/11/20      PT LONG TERM GOAL #2   Title Decrease L groin/pubic pain by >/= 50-75% with standing to allow patient to be able to return to working    Status Partially Met   07/26/20 - Pt reporting increased standing tolerance up to ~4.5 hrs (2.5 hrs on eval) allowing for better work tolerance   Target Date 09/11/20      PT LONG TERM GOAL #3   Title Patient will maintain neutral SIJ alignment for >/= 2 weeks w/o need for manual correction    Status Partially Met   07/26/20 - SIJ alignment remains neutral since eval   Target Date 09/11/20      PT LONG TERM GOAL #4   Title Patient will demonstrate improved B proximal LE strength to >/=  4+/5 for improved stability and ease of mobility    Status On-going    Target Date 09/11/20      PT LONG TERM GOAL #5   Title Patient to report ability to perform ADLs, household, and work-related tasks without increased pain    Status Partially Met   07/26/20 - Pt reporting increased standing tolerance allowing for better work tolerance   Target Date 09/11/20                   Plan - 07/26/20 0847     Clinical Impression Statement Zachary Fletcher reports improving standing tolerance at work since MET perform on eval visit and reassessment of SIJ alignment today reveals neutral positioning. Treatment session focusing on instruction in HEP targeting proximal LE stretches with multiple alternative options provided as well as basic lumbopelvic strengthening to promote maintenance of neutral pelvis. Pt able to provide good return demonstration and understanding of HEP, already noting some reduction in discomfort following stretches. Given high copay, pt would like to try working with HEP on his own for a while to determine impact on tolerance for work activities and if he is able to work back up to tolerating an 8-hr workday, he will probably not return to PT. Pt aware of need to remain active with PT within the next 30 days if he does need to return to PT.    Personal Factors and Comorbidities Time since onset of injury/illness/exacerbation;Past/Current Experience;Comorbidity 2;Fitness    Comorbidities Morbid obesity, OSA    Examination-Activity Limitations Stand;Sit;Locomotion Level    Examination-Participation Restrictions Occupation;Community Activity;Yard Work;Shop;Cleaning    Rehab Potential Good    PT Frequency 1x / week   1x/wk to biweekly d/t high copay   PT Duration 8 weeks    PT Treatment/Interventions ADLs/Self Care Home Management;Cryotherapy;Electrical Stimulation;Iontophoresis 57m/ml Dexamethasone;Ultrasound;Therapeutic activities;Therapeutic exercise;Neuromuscular  re-education;Patient/family education;Manual techniques;Passive range of motion;Dry needling    PT Next Visit Plan Reassess SIJ alignment; review & update HEP as indicated for proximal flexiblity and pelvic stabilization    PT Home Exercise Plan Access Code: [email or web address](5/10 - SIJ dysfunction education); [email or web address] (5/19 - initial HEP)    Consulted and Agree with Plan of Care Patient             Patient will benefit from skilled therapeutic intervention in order to improve the following deficits and impairments:  Decreased activity tolerance,Decreased endurance,Decreased  mobility,Decreased range of motion,Decreased strength,Difficulty walking,Increased fascial restricitons,Increased muscle spasms,Impaired perceived functional ability,Impaired flexibility,Improper body mechanics,Postural dysfunction,Pain,Obesity  Visit Diagnosis: Other symptoms and signs involving the musculoskeletal system  Other muscle spasm  Muscle weakness (generalized)  Difficulty in walking, not elsewhere classified     Problem List There are no problems to display for this patient.   Percival Spanish, PT, MPT 07/26/2020, 10:26 AM  [facility] [address]  [place], [place], [address] Phone: [phone]   Fax:  [phone]  Name: Zachary Fletcher MRN: [id number] Date of Birth: [birth year]    PHYSICAL THERAPY DISCHARGE SUMMARY  Visits from Start of Care: 2  Current functional level related to goals / functional outcomes:   Refer to above clinical impression for status as of last visit on 07/26/2020. Patient was placed on hold for 30 days and has not needed to return to PT, therefore will proceed with discharge from PT for this episode.   Remaining deficits:   As above.    Education / Equipment:   HEP   Patient agrees to discharge. Patient goals were partially met. Patient is being discharged due to not returning since the last  visit.   Percival Spanish, PT, MPT 09/14/20, 11:17 AM  [facility] [address]  [place], [place], [address] Phone: [phone]   Fax:  [phone]

## 2020-07-26 NOTE — Patient Instructions (Signed)
Access Code: [email or web address] URL: [email or web address] Date: 07/26/2020 Prepared by: Annie Paras  Exercises Hooklying Hamstring Stretch with Strap - 2-3 x daily - 7 x weekly - 3 reps - 30 sec hold Supine ITB Stretch with Strap - 2-3 x daily - 7 x weekly - 3 reps - 30 sec hold Supine Quadriceps Stretch with Strap on Table - 2-3 x daily - 7 x weekly - 3 reps - 30 sec hold Hip Adductors and Hamstring Stretch with Strap - 2-3 x daily - 7 x weekly - 3 reps - 30 sec hold Supine Hip Adductor Stretch - 2-3 x daily - 7 x weekly - 3 reps - 30 sec hold Supine Piriformis Stretch with Foot on Ground - 2-3 x daily - 7 x weekly - 3 reps - 30 sec hold Seated Hamstring Stretch - 2-3 x daily - 7 x weekly - 3 reps - 30 sec hold Seated Hip Flexor Stretch - 2-3 x daily - 7 x weekly - 3 reps - 30 sec hold Seated Hip Adductor Stretch - 2-3 x daily - 7 x weekly - 3 reps - 30 sec hold Seated Table Piriformis Stretch - 2-3 x daily - 7 x weekly - 3 reps - 30 sec hold ITB Stretch at Wall - 2-3 x daily - 7 x weekly - 3 reps - 30 sec hold Standing ITB Stretch - 2-3 x daily - 7 x weekly - 3 reps - 30 sec hold Supine Hip Adduction Isometric with Ball - 2 x daily - 7 x weekly - 2 sets - 10 reps - 5 sec hold Hooklying Clamshell with Resistance - 2 x daily - 7 x weekly - 2 sets - 10 reps - 5 sec hold Supine Bridge with Resistance Band - 2 x daily - 7 x weekly - 2 sets - 10 reps - 5 sec hold

## 2020-07-27 ENCOUNTER — Encounter: Payer: 59 | Admitting: Nurse Practitioner

## 2020-08-09 NOTE — Progress Notes (Signed)
      HPI: FU palpitations.  Patient seen in the emergency room November 29, 2019 with complaints of palpitations and cough.  Chest x-ray showed mild cardiac enlargement. Potassium 4.2 and hemoglobin 15.2.  QT interval also felt to be long.  Echocardiogram October 2021 showed normal LV function.  Monitor November 2021 showed sinus rhythm with 32nd run of SVT and 4 beats of nonsustained ventricular tachycardia.  Toprol added. Since last seen he has occasional brief palpitations not sustained.  He denies dyspnea, chest pain or syncope.  Current Outpatient Medications  Medication Sig Dispense Refill   metoprolol succinate (TOPROL XL) 25 MG 24 hr tablet Take 1 tablet (25 mg total) by mouth daily. 90 tablet 3   No current facility-administered medications for this visit.     Past Medical History:  Diagnosis Date   Sleep apnea, obstructive     Past Surgical History:  Procedure Laterality Date   No prior surgery      Social History   Socioeconomic History   Marital status: Married    Spouse name: Not on file   Number of children: Not on file   Years of education: Not on file   Highest education level: Not on file  Occupational History   Not on file  Tobacco Use   Smoking status: Never   Smokeless tobacco: Never  Vaping Use   Vaping Use: Never used  Substance and Sexual Activity   Alcohol use: Yes    Comment: Rare   Drug use: Never   Sexual activity: Not on file  Other Topics Concern   Not on file  Social History Narrative   Not on file   Social Determinants of Health   Financial Resource Strain: Not on file  Food Insecurity: Not on file  Transportation Needs: Not on file  Physical Activity: Not on file  Stress: Not on file  Social Connections: Not on file  Intimate Partner Violence: Not on file    Family History  Problem Relation Age of Onset   Sleep apnea Father    Diabetes Other     ROS: no fevers or chills, productive cough, hemoptysis, dysphasia,  odynophagia, melena, hematochezia, dysuria, hematuria, rash, seizure activity, orthopnea, PND, pedal edema, claudication. Remaining systems are negative.  Physical Exam: Well-developed obese in no acute distress.  Skin is warm and dry.  HEENT is normal.  Neck is supple.  Chest is clear to auscultation with normal expansion.  Cardiovascular exam is regular rate and rhythm.  Abdominal exam nontender or distended. No masses palpated. Extremities show no edema. neuro grossly intact  A/P  1 palpitations-this is felt likely to be secondary to SVT.  His symptoms have improved on beta-blockade which we will continue.  Note his LV function is normal.  Can consider referral for ablation in the future if needed.  2 morbid obesity-continue efforts at weight loss.  3 obstructive sleep apnea-continue CPAP.  Kirk Ruths, MD

## 2020-08-16 ENCOUNTER — Encounter: Payer: 59 | Admitting: Nurse Practitioner

## 2020-08-22 ENCOUNTER — Other Ambulatory Visit: Payer: Self-pay

## 2020-08-22 ENCOUNTER — Encounter: Payer: Self-pay | Admitting: Cardiology

## 2020-08-22 ENCOUNTER — Ambulatory Visit (INDEPENDENT_AMBULATORY_CARE_PROVIDER_SITE_OTHER): Payer: 59 | Admitting: Cardiology

## 2020-08-22 VITALS — BP 134/68 | HR 74 | Ht 74.5 in | Wt >= 6400 oz

## 2020-08-22 DIAGNOSIS — R002 Palpitations: Secondary | ICD-10-CM | POA: Diagnosis not present

## 2020-08-22 NOTE — Patient Instructions (Signed)

## 2020-11-19 ENCOUNTER — Ambulatory Visit (INDEPENDENT_AMBULATORY_CARE_PROVIDER_SITE_OTHER): Payer: BC Managed Care – PPO | Admitting: Nurse Practitioner

## 2020-11-19 ENCOUNTER — Other Ambulatory Visit: Payer: Self-pay

## 2020-11-19 ENCOUNTER — Encounter: Payer: Self-pay | Admitting: Nurse Practitioner

## 2020-11-19 VITALS — BP 122/78 | HR 76 | Temp 97.0°F | Ht 74.0 in | Wt >= 6400 oz

## 2020-11-19 DIAGNOSIS — Z0001 Encounter for general adult medical examination with abnormal findings: Secondary | ICD-10-CM

## 2020-11-19 DIAGNOSIS — R7303 Prediabetes: Secondary | ICD-10-CM

## 2020-11-19 DIAGNOSIS — G629 Polyneuropathy, unspecified: Secondary | ICD-10-CM | POA: Diagnosis not present

## 2020-11-19 DIAGNOSIS — Z9989 Dependence on other enabling machines and devices: Secondary | ICD-10-CM | POA: Insufficient documentation

## 2020-11-19 DIAGNOSIS — Z13220 Encounter for screening for lipoid disorders: Secondary | ICD-10-CM

## 2020-11-19 DIAGNOSIS — R1032 Left lower quadrant pain: Secondary | ICD-10-CM

## 2020-11-19 DIAGNOSIS — Z136 Encounter for screening for cardiovascular disorders: Secondary | ICD-10-CM | POA: Diagnosis not present

## 2020-11-19 DIAGNOSIS — G4733 Obstructive sleep apnea (adult) (pediatric): Secondary | ICD-10-CM | POA: Insufficient documentation

## 2020-11-19 HISTORY — DX: Left lower quadrant pain: R10.32

## 2020-11-19 LAB — CBC WITH DIFFERENTIAL/PLATELET
Basophils Absolute: 0 10*3/uL (ref 0.0–0.1)
Basophils Relative: 0.4 % (ref 0.0–3.0)
Eosinophils Absolute: 0.1 10*3/uL (ref 0.0–0.7)
Eosinophils Relative: 0.7 % (ref 0.0–5.0)
HCT: 44.8 % (ref 39.0–52.0)
Hemoglobin: 14.9 g/dL (ref 13.0–17.0)
Lymphocytes Relative: 19.1 % (ref 12.0–46.0)
Lymphs Abs: 1.6 10*3/uL (ref 0.7–4.0)
MCHC: 33.2 g/dL (ref 30.0–36.0)
MCV: 87.8 fl (ref 78.0–100.0)
Monocytes Absolute: 0.7 10*3/uL (ref 0.1–1.0)
Monocytes Relative: 8.3 % (ref 3.0–12.0)
Neutro Abs: 6.1 10*3/uL (ref 1.4–7.7)
Neutrophils Relative %: 71.5 % (ref 43.0–77.0)
Platelets: 156 10*3/uL (ref 150.0–400.0)
RBC: 5.11 Mil/uL (ref 4.22–5.81)
RDW: 13.6 % (ref 11.5–15.5)
WBC: 8.6 10*3/uL (ref 4.0–10.5)

## 2020-11-19 LAB — COMPREHENSIVE METABOLIC PANEL
ALT: 16 U/L (ref 0–53)
AST: 10 U/L (ref 0–37)
Albumin: 3.7 g/dL (ref 3.5–5.2)
Alkaline Phosphatase: 71 U/L (ref 39–117)
BUN: 16 mg/dL (ref 6–23)
CO2: 25 mEq/L (ref 19–32)
Calcium: 9.2 mg/dL (ref 8.4–10.5)
Chloride: 106 mEq/L (ref 96–112)
Creatinine, Ser: 0.84 mg/dL (ref 0.40–1.50)
GFR: 117.1 mL/min (ref >60.00)
Glucose, Bld: 101 mg/dL — ABNORMAL HIGH (ref 70–99)
Potassium: 4.5 mEq/L (ref 3.5–5.1)
Sodium: 139 mEq/L (ref 135–145)
Total Bilirubin: 0.4 mg/dL (ref 0.2–1.2)
Total Protein: 6.5 g/dL (ref 6.0–8.3)

## 2020-11-19 LAB — IBC + FERRITIN
Ferritin: 192.7 ng/mL (ref 22.0–322.0)
Iron: 55 ug/dL (ref 42–165)
Saturation Ratios: 18.7 % — ABNORMAL LOW (ref 20.0–50.0)
TIBC: 294 ug/dL (ref 250.0–450.0)
Transferrin: 210 mg/dL — ABNORMAL LOW (ref 212.0–360.0)

## 2020-11-19 LAB — HEMOGLOBIN A1C: Hgb A1c MFr Bld: 5.9 % (ref 4.6–6.5)

## 2020-11-19 LAB — TSH: TSH: 1.95 u[IU]/mL (ref 0.35–5.50)

## 2020-11-19 LAB — LDL CHOLESTEROL, DIRECT: Direct LDL: 82 mg/dL

## 2020-11-19 LAB — VITAMIN B12: Vitamin B-12: 287 pg/mL (ref 211–911)

## 2020-11-19 NOTE — Patient Instructions (Addendum)
Go to lab for blood draw.  Maintain DASH diet and daily exercise.  How to Increase Your Level of Physical Activity Getting regular physical activity is important for your overall health and well-being. Most people do not get enough exercise. There are easy ways to increase your level of physical activity, even if you have not been very active in the past or if you are just starting out. What are the benefits of physical activity? Physical activity has many short-term and long-term benefits. Being active on a regular basis can improve your physical and mental health as well as provide other benefits. Physical health benefits Helping you lose weight or maintain a healthy weight. Strengthening your muscles and bones. Reducing your risk of certain long-term (chronic) diseases, including heart disease, cancer, and diabetes. Being able to move around more easily and for longer periods of time without getting tired (increased endurance or stamina). Improving your ability to fight off illness (enhanced immunity). Being able to sleep better. Helping you stay healthy as you get older, including: Helping you stay mobile, or capable of walking and moving around. Preventing accidents, such as falls. Increasing life expectancy. Mental health benefits Boosting your mood and improving your self-esteem. Lowering your chance of having mental health problems, such as depression or anxiety. Helping you feel good about your body. Other benefits Finding new sources of fun and enjoyment. Meeting new people who share a common interest. Before you begin If you have a chronic illness or have not been active for a while, check with your health care provider about how to get started. Ask your health care provider what activities are safe for you. Start out slowly. Walking or doing some simple chair exercises is a good place to start, especially if you have not been active before or for a long time. Set goals that  you can work toward. Ask your health care provider how much exercise is best for you. In general, most adults should: Do moderate-intensity exercise for at least 150 minutes each week (30 minutes on most days of the week) or vigorous exercise for at least 75 minutes each week, or a combination of these. Moderate-intensity exercise can include walking at a quick pace, biking, yoga, water aerobics, or gardening. Vigorous exercise involves activities that take more effort, such as jogging or running, playing sports, swimming laps, or jumping rope. Do strength exercises on at least 2 days each week. This can include weight lifting, body weight exercises, and resistance-band exercises. How to be more physically active Make a plan  Try to find activities that you enjoy. You are more likely to commit to an exercise routine if it does not feel like a chore. If you have bone or joint problems, choose low-impact exercises, like walking or swimming. Use these tips for being successful with an exercise plan: Find a workout partner for accountability. Join a group or class, such as an aerobics class, cycling class, or sports team. Make family time active. Go for a walk, bike, or swim. Include a variety of exercises each week. Consider using a fitness tracker, such as a mobile phone app or a device worn like a watch, that will count the number of steps you take each day. Many people strive to reach 10,000 steps a day. Find ways to be active in your daily routines Besides your formal exercise plans, you can find ways to do physical activity during your daily routines, such as: Walking or biking to work or to the store. Taking  the stairs instead of the elevator. Parking farther away from the door at work or at the store. Planning walking meetings. Walking around while you are on the phone. Where to find more information Centers for Disease Control and Prevention: WorkDashboard.es President's  Council on Fitness, Sports & Nutrition: www.fitness.gov ChooseMyPlate: MassVoice.es Contact a health care provider if: You have headaches, muscle aches, or joint pain that is concerning. You feel dizzy or light-headed while exercising. You faint. You feel your heart skipping, racing, or fluttering. You have chest pain while exercising. Summary Exercise benefits your mind and body at any age, even if you are just starting out. If you have a chronic illness or have not been active for a while, check with your health care provider before increasing your physical activity. Choose activities that are safe and enjoyable for you. Ask your health care provider what activities are safe for you. Start slowly. Tell your health care provider if you have problems as you start to increase your activity level. This information is not intended to replace advice given to you by your health care provider. Make sure you discuss any questions you have with your health care provider. Document Revised: 06/22/2020 Document Reviewed: 06/22/2020 Elsevier Patient Education  [place].

## 2020-11-19 NOTE — Assessment & Plan Note (Addendum)
Numbness and tingling in both feet, worsening in last 12month. No claudication, no ulcers, no LE edema. No ETOH or ETOH consumption.  check iron, B12, TSH, and HgbA1c Advised to perform daily foot check and skin care. Refer to neurology if labs are normal

## 2020-11-19 NOTE — Addendum Note (Signed)
Addended by: Leana Gamer on: 11/19/2020 03:32 PM   Modules accepted: Orders

## 2020-11-19 NOTE — Assessment & Plan Note (Signed)
Declined referral to weight management clinic and nutritionist. Provided printed information on ways to increase exercise and decrease calorie intake

## 2020-11-19 NOTE — Assessment & Plan Note (Signed)
Resolved with PT 

## 2020-11-19 NOTE — Assessment & Plan Note (Signed)
Managed by [facility]

## 2020-11-19 NOTE — Progress Notes (Signed)
Subjective:    Patient ID: Zachary Fletcher, male    DOB: December 14, 1990, 30 y.o.   MRN: [id number]  Patient presents today for CPE and eval of chronic conditions  HPI Neuropathy Numbness and tingling in both feet, worsening in last 69month. No claudication, no ulcers, no LE edema. No ETOH or ETOH consumption.  check iron, B12, TSH, and HgbA1c Advised to perform daily foot check and skin care. Refer to neurology if labs are normal  [place] Declined referral to weight management clinic and nutritionist. Provided printed information on ways to increase exercise and decrease calorie intake  Left groin pain Resolved with PT  OSA on CPAP Managed by [facility]:will schedule Dental:will schedule Diet:regular Exercise:none Weight:  Wt Readings from Last 3 Encounters:  11/19/20 (!) 430 lb 12.8 oz (195.4 kg)  08/22/20 (!) 428 lb 1.9 oz (194.2 kg)  06/13/20 (!) 435 lb (197.3 kg)    Sexual History (orientation,birth control, marital status, STD):married, male partner, denies need for STD screen.  [facility]  Decreased Interest 3  Down, Depressed, Hopeless 2  PHQ - 2 Score 5  Altered sleeping 1  Tired, decreased energy 2  Change in appetite 2  Feeling bad or failure about yourself  3  Trouble concentrating 2  Moving slowly or fidgety/restless 2  Suicidal thoughts 0  PHQ-9 Score 17  Difficult doing work/chores Somewhat difficult   Immunizations: (TDAP, Hep C screen, Pneumovax, Influenza, zoster)  Health Maintenance  Topic Date Due   COVID-19 Vaccine (1) Never done   Flu Shot  06/07/2021*   Tetanus Vaccine  11/19/2021*   Hepatitis C Screening: USPSTF Recommendation to screen - Ages 18-79 yo.  11/19/2021*   HIV Screening  11/19/2021*   Pneumococcal Vaccination  Aged Out   HPV Vaccine  Aged Out  *Topic was postponed. The date shown is not the original due date.   Fall Risk: Fall Risk   06/13/2020  Falls in the past year? 0  Number falls in past yr: 0  Injury with Fall? 0  Risk for fall due to : No Fall Risks  Follow up Falls evaluation completed   Medications and allergies reviewed with patient and updated if appropriate.  Patient Active Problem List   Diagnosis Date Noted   Morbid [place] ([place] 11/19/2020   Neuropathy 11/19/2020   OSA on CPAP 11/19/2020   Left groin pain 11/19/2020    Current Outpatient Medications on File Prior to Visit  Medication Sig Dispense Refill   metoprolol succinate (TOPROL XL) 25 MG 24 hr tablet Take 1 tablet (25 mg total) by mouth daily. 90 tablet 3   No current facility-administered medications on file prior to visit.    Past Medical History:  Diagnosis Date   Sleep apnea, obstructive     Past Surgical History:  Procedure Laterality Date   No prior surgery      Social History   Socioeconomic History   Marital status: Married    Spouse name: Not on file   Number of children: Not on file   Years of education: Not on file   Highest education level: Not on file  Occupational History   Not on file  Tobacco Use   Smoking status: Never   Smokeless tobacco: Never  Vaping Use   Vaping Use: Never used  Substance and Sexual Activity   Alcohol use: Yes    Comment: Rare   Drug use: Never   Sexual  activity: Not on file  Other Topics Concern   Not on file  Social History Narrative   Not on file   Social Determinants of Health   Financial Resource Strain: Not on file  Food Insecurity: Not on file  Transportation Needs: Not on file  Physical Activity: Not on file  Stress: Not on file  Social Connections: Not on file    Family History  Problem Relation Age of Onset   Sleep apnea Father    Diabetes Other         Review of Systems  Constitutional:  Negative for fever, malaise/fatigue and weight loss.  HENT:  Negative for congestion and sore throat.   Eyes:        Negative for visual changes  Respiratory:   Negative for cough and shortness of breath.   Cardiovascular:  Negative for chest pain, palpitations, claudication, leg swelling and PND.  Gastrointestinal:  Negative for blood in stool, constipation, diarrhea and heartburn.  Genitourinary:  Negative for dysuria, frequency and urgency.  Musculoskeletal:  Negative for falls, joint pain and myalgias.  Skin:  Negative for rash.  Neurological:  Negative for dizziness, sensory change and headaches.  Endo/Heme/Allergies:  Does not bruise/bleed easily.  Psychiatric/Behavioral:  Positive for depression. Negative for hallucinations, substance abuse and suicidal ideas. The patient is nervous/anxious. The patient does not have insomnia.    Objective:   Vitals:   11/19/20 0948  BP: 122/78  Pulse: 76  Temp: (!) 97 F (36.1 C)  SpO2: 98%    Body mass index is 55.31 kg/m.   Physical Examination:  Physical Exam Vitals reviewed.  Constitutional:      General: He is not in acute distress.    Appearance: He is well-developed. He is obese.  HENT:     Right Ear: Tympanic membrane, ear canal and external ear normal.     Left Ear: Tympanic membrane, ear canal and external ear normal.  Eyes:     Extraocular Movements: Extraocular movements intact.     Conjunctiva/sclera: Conjunctivae normal.  Cardiovascular:     Rate and Rhythm: Normal rate and regular rhythm.     Pulses: Normal pulses.     Heart sounds: Normal heart sounds.  Pulmonary:     Effort: Pulmonary effort is normal. No respiratory distress.     Breath sounds: Normal breath sounds.  Chest:     Chest wall: No tenderness.  Abdominal:     General: Bowel sounds are normal.     Palpations: Abdomen is soft.  Musculoskeletal:        General: Normal range of motion.     Cervical back: Normal range of motion and neck supple.     Right lower leg: No edema.     Left lower leg: No edema.  Lymphadenopathy:     Cervical: No cervical adenopathy.  Skin:    General: Skin is warm and dry.   Neurological:     Mental Status: He is alert and oriented to person, place, and time.     Deep Tendon Reflexes: Reflexes are normal and symmetric.  Psychiatric:        Mood and Affect: Mood normal.        Behavior: Behavior normal.        Thought Content: Thought content normal.    ASSESSMENT and PLAN: This visit occurred during the SARS-CoV-2 public health emergency.  Safety protocols were in place, including screening questions prior to the visit, additional usage of staff PPE, and extensive  cleaning of exam room while observing appropriate contact time as indicated for disinfecting solutions.   Daevion was seen today for annual exam.  Diagnoses and all orders for this visit:  Encounter for preventative adult health care exam with abnormal findings -     Comprehensive metabolic panel -     CBC with Differential/Platelet -     Cancel: Lipid panel  Encounter for lipid screening for cardiovascular disease -     Cancel: Lipid panel -     Direct LDL  Morbid obesity ([place]) -     TSH -     Hemoglobin A1c -     Direct LDL  Neuropathy -     TSH -     Hemoglobin A1c -     B12 -     IBC + Ferritin     Problem List Items Addressed This Visit       Nervous and Auditory   Neuropathy    Numbness and tingling in both feet, worsening in last 22month. No claudication, no ulcers, no LE edema. No ETOH or ETOH consumption.  check iron, B12, TSH, and HgbA1c Advised to perform daily foot check and skin care. Refer to neurology if labs are normal      Relevant Orders   TSH   Hemoglobin A1c   B12   IBC + Ferritin     Other   Morbid obesity ([place]    Declined referral to weight management clinic and nutritionist. Provided printed information on ways to increase exercise and decrease calorie intake      Relevant Orders   TSH   Hemoglobin A1c   Direct LDL   Other Visit Diagnoses     Encounter for preventative adult health care exam with abnormal findings    -  Primary    Relevant Orders   Comprehensive metabolic panel   CBC with Differential/Platelet   Encounter for lipid screening for cardiovascular disease       Relevant Orders   Direct LDL       Follow up: Return in about 3 months (around 02/18/2021) for neuropathy and weight management, PHQ report.  CWilfred Lacy NP

## 2020-11-19 NOTE — Assessment & Plan Note (Signed)
hgbA1c at 5.9% Advised to maintain DASh diet and regular exercise. He declined referral to nutritionist and weight management clinic at this time.

## 2021-01-10 ENCOUNTER — Encounter: Payer: Self-pay | Admitting: Neurology

## 2021-03-25 ENCOUNTER — Other Ambulatory Visit: Payer: Self-pay | Admitting: Cardiology

## 2021-03-25 ENCOUNTER — Ambulatory Visit: Payer: BC Managed Care – PPO | Admitting: Neurology

## 2021-03-25 DIAGNOSIS — R059 Cough, unspecified: Secondary | ICD-10-CM | POA: Diagnosis not present

## 2021-03-25 DIAGNOSIS — Z20822 Contact with and (suspected) exposure to covid-19: Secondary | ICD-10-CM | POA: Diagnosis not present

## 2021-03-25 DIAGNOSIS — J209 Acute bronchitis, unspecified: Secondary | ICD-10-CM | POA: Diagnosis not present

## 2021-05-27 ENCOUNTER — Ambulatory Visit: Payer: BC Managed Care – PPO | Admitting: Neurology

## 2021-05-27 ENCOUNTER — Encounter: Payer: Self-pay | Admitting: Neurology

## 2021-05-27 ENCOUNTER — Other Ambulatory Visit: Payer: Self-pay

## 2021-05-27 VITALS — BP 142/86 | HR 81 | Ht 74.0 in | Wt >= 6400 oz

## 2021-05-27 DIAGNOSIS — R202 Paresthesia of skin: Secondary | ICD-10-CM | POA: Diagnosis not present

## 2021-05-27 DIAGNOSIS — D3610 Benign neoplasm of peripheral nerves and autonomic nervous system, unspecified: Secondary | ICD-10-CM

## 2021-05-27 NOTE — Progress Notes (Signed)
?[facility] ?Neurology Division ?Clinic Note - Initial Visit ? ? ?Date: 05/27/21 ? ?Zachary Fletcher ?MRN: [id number] ?DOB: October 31, 1990 ? ? ?Dear [name], NP: ? ?Thank you for your kind referral of Zachary Fletcher for consultation of numbness/tingling feet. Although his history is well known to you, please allow [place] to reiterate it for the purpose of our medical record. The patient was accompanied to the clinic by self. ?  ? ?History of Present Illness: ?Zachary Fletcher is a 31 y.o. right-handed male with palpitation, morbid obesity, and OSA presenting for evaluation of numbness/tingling of the toes.  Starting around September 2022, he began having numbness involving left great toe.  Later, he developed similar symptoms in the right toe to a lesser degree.  The numbness involve the entire toe and part of the ball of the foot.  He does not have numbness involving the soles or dorsum of the foot.  He denies weakness or tingling.  He has some imbalance, no falls.  He notices that jogging or walking tends to aggravate it.  It is less noticeable when he wear socks.   ? ?He works as a Software engineer. Nonsmoker.   ? ?Out-side paper records, electronic medical record, and images have been reviewed where available and summarized as:  ?Lab Results  ?Component Value Date  ? HGBA1C 5.9 11/19/2020  ? ?Lab Results  ?Component Value Date  ? [id number] 11/19/2020  ? ?Lab Results  ?Component Value Date  ? TSH 1.95 11/19/2020  ? ?No results found for: ESRSEDRATE, POCTSEDRATE ? ?Past Medical History:  ?Diagnosis Date  ? Sleep apnea, obstructive   ? Tachycardia   ? ? ?Past Surgical History:  ?Procedure Laterality Date  ? No prior surgery    ? ? ? ?Medications:  ?Outpatient Encounter Medications as of 05/27/2021  ?Medication Sig  ? metoprolol succinate (TOPROL-XL) 25 MG 24 hr tablet TAKE 1 TABLET BY MOUTH EVERY DAY  ? ?No facility-administered encounter medications on file as of 05/27/2021.  ? ? ?Allergies: No Known  Allergies ? ?Family History: ?Family History  ?Problem Relation Age of Onset  ? Sleep apnea Father   ? Diabetes Other   ? ? ?Social History: ?Social History  ? ?Tobacco Use  ? Smoking status: Never  ? Smokeless tobacco: Never  ?Vaping Use  ? Vaping Use: Never used  ?Substance Use Topics  ? Alcohol use: Yes  ?  Comment: Rare  ? Drug use: Never  ? ?Social History  ? ?Social History Narrative  ? Right handed   ? Live with spouse   ? ? ?Vital Signs:  ?BP (!) 142/86   Pulse 81   Ht '6\' 2"'$  (1.88 m)   Wt (!) 439 lb (199.1 kg)   SpO2 99%   BMI 56.36 kg/m?  ? ?Neurological Exam: ?MENTAL STATUS including orientation to time, place, person, recent and remote memory, attention span and concentration, language, and fund of knowledge is normal.  Speech is not dysarthric. ? ?CRANIAL NERVES: ?II:  No visual field defects.  ?III-IV-VI: Pupils equal round and reactive to light.  Normal conjugate, extra-ocular eye movements in all directions of gaze.  No nystagmus.  No ptosis.   ?V:  Normal facial sensation.    ?VII:  Normal facial symmetry and movements.   ?VIII:  Normal hearing and vestibular function.   ?IX-X:  Normal palatal movement.   ?XI:  Normal shoulder shrug and head rotation.   ?XII:  Normal tongue strength and range of motion, no deviation  or fasciculation. ? ?MOTOR:  Motor strength is 5/5 throughout, including distally in the feet.  No atrophy, fasciculations or abnormal movements.  No pronator drift.  ? ?MSRs:  ?Right        Left                  ?brachioradialis 2+  2+  ?biceps 2+  2+  ?triceps 2+  2+  ?patellar 2+  2+  ?ankle jerk 2+  2+  ?Hoffman no  no  ?plantar response down  down  ? ?SENSORY:  Temperature and pin prick reduced over the left great toe, vibration is intact distally.  Rhomberg testing is absent.  ? ?COORDINATION/GAIT: Normal finger-to- nose-finger.  Intact rapid alternating movements bilaterally.  Gait narrow based and stable. Tandem and stressed gait intact.  ? ? ?IMPRESSION: ?Left >> right great  toe numbness is more suggestive of Morton's neuroma.  I do not appreciate any findings on exam that would favor neuropathy and he does not have risk factors for neuropathy.  To be complete, he will return for NCS/EMG of the lower extremities.  I will seek the opinion of podiatry for suspected neuroma.  He denies severe painful paresthesias.  Given isolated numbness, there is no role for medication. ? ?Thank you for allowing me to participate in patient's care.  If I can answer any additional questions, I would be pleased to do so.   ? ?Sincerely, ? ? ? ?Chrishon Martino K. Posey Pronto, DO ? ?

## 2021-05-27 NOTE — Patient Instructions (Addendum)
Nerve testing of the feet ? ?Refer to podiatry for left > right toe numbness ? ?ELECTROMYOGRAM AND NERVE CONDUCTION STUDIES (EMG/NCS) INSTRUCTIONS ? ?How to Prepare ?The neurologist conducting the EMG will need to know if you have certain medical conditions. Tell the neurologist and other EMG lab personnel if you: ?Have a pacemaker or any other electrical medical device ?Take blood-thinning medications ?Have hemophilia, a blood-clotting disorder that causes prolonged bleeding ?Bathing ?Take a shower or bath shortly before your exam in order to remove oils from your skin. Don?t apply lotions or creams before the exam.  ?What to Expect ?You?ll likely be asked to change into a hospital gown for the procedure and lie down on an examination table. The following explanations can help you understand what will happen during the exam.  ?Electrodes. The neurologist or a technician places surface electrodes at various locations on your skin depending on where you?re experiencing symptoms. Or the neurologist may insert needle electrodes at different sites depending on your symptoms.  ?Sensations. The electrodes will at times transmit a tiny electrical current that you may feel as a twinge or spasm. The needle electrode may cause discomfort or pain that usually ends shortly after the needle is removed. ?If you are concerned about discomfort or pain, you may want to talk to the neurologist about taking a short break during the exam.  ?Instructions. During the needle EMG, the neurologist will assess whether there is any spontaneous electrical activity when the muscle is at rest - activity that isn?t present in healthy muscle tissue - and the degree of activity when you slightly contract the muscle.  ?He or she will give you instructions on resting and contracting a muscle at appropriate times. Depending on what muscles and nerves the neurologist is examining, he or she may ask you to change positions during the exam.  ?After your  EMG ?You may experience some temporary, minor bruising where the needle electrode was inserted into your muscle. This bruising should fade within several days. If it persists, contact your primary care doctor.  ? ?

## 2021-06-21 ENCOUNTER — Ambulatory Visit: Payer: BC Managed Care – PPO | Admitting: Podiatry

## 2021-06-21 ENCOUNTER — Ambulatory Visit (INDEPENDENT_AMBULATORY_CARE_PROVIDER_SITE_OTHER): Payer: BC Managed Care – PPO

## 2021-06-21 DIAGNOSIS — D3610 Benign neoplasm of peripheral nerves and autonomic nervous system, unspecified: Secondary | ICD-10-CM | POA: Diagnosis not present

## 2021-06-21 DIAGNOSIS — R200 Anesthesia of skin: Secondary | ICD-10-CM | POA: Diagnosis not present

## 2021-06-21 DIAGNOSIS — M779 Enthesopathy, unspecified: Secondary | ICD-10-CM | POA: Diagnosis not present

## 2021-06-21 DIAGNOSIS — D3613 Benign neoplasm of peripheral nerves and autonomic nervous system of lower limb, including hip: Secondary | ICD-10-CM

## 2021-06-21 MED ORDER — MELOXICAM 15 MG PO TABS: 15.0000 mg | ORAL_TABLET | Freq: Every day | ORAL | 0 refills | Status: DC

## 2021-06-21 NOTE — Patient Instructions (Signed)

## 2021-06-21 NOTE — Progress Notes (Signed)
Subjective:  ? ?Patient ID: Zachary Fletcher, male   DOB: 31 y.o.   MRN: [id number]  ? ?HPI ?31 year old male presents the office today with concerns of numbness, discomfort to both of his feet with left side worse than right.  He feels that the numbness has been spreading.  It started up in the big toes been going over to the ball of his foot he also gets discomfort in the arch of his foot as well.  He had been seen by neurology and nerve conduction test has been ordered presents referred for possible neuromas.  He has no radiating pain.  No recent injuries.  Going on for at least 6 months.  He states that he did try to get new shoes and wearing more of an orthopedic shoe he reports which did help with the discomfort but the numbness still remains.  The numbness is constant. ? ?States he has a history of gout but has been a long time since he has had symptoms. ? ? ?Review of Systems  ?All other systems reviewed and are negative. ? ?Past Medical History:  ?Diagnosis Date  ? Sleep apnea, obstructive   ? Tachycardia   ? ? ?Past Surgical History:  ?Procedure Laterality Date  ? No prior surgery    ? ? ? ?Current Outpatient Medications:  ?  meloxicam (MOBIC) 15 MG tablet, Take 1 tablet (15 mg total) by mouth daily., Disp: 30 tablet, Rfl: 0 ?  metoprolol succinate (TOPROL-XL) 25 MG 24 hr tablet, TAKE 1 TABLET BY MOUTH EVERY DAY, Disp: 90 tablet, Rfl: 3 ? ?No Known Allergies ? ? ? ?   ?Objective:  ?Physical Exam  ?General: AAO x3, NAD ? ?Dermatological: Skin is warm, dry and supple bilateral. There are no open sores, no preulcerative lesions, no rash or signs of infection present. ? ?Vascular: Dorsalis Pedis artery and Posterior Tibial artery pedal pulses are 2/4 bilateral with immedate capillary fill time.  There is no pain with calf compression, swelling, warmth, erythema.  ? ?Neruologic: Sensation appears to be intact with Thornell Mule monofilament.  There is mild decrease the hallux on the left  side. ? ?Musculoskeletal: There is mild tenderness palpation mostly on the left foot worse than right on the first, second interspaces as well as the second MPJ.  No pain with MPJ range of motion.  Small palpable neuroma versus bursa present on the interspace.  No erythema or warmth. ? ?Gait: Unassisted, Nonantalgic.  ? ? ?   ?Assessment:  ? ?31 year old male with concern for neuroma versus small fiber neuropathy ? ?   ?Plan:  ?-Treatment options discussed including all alternatives, risks, and complications ?-Etiology of symptoms were discussed ?-X-rays were obtained and reviewed with the patient.  3 views of bilateral feet obtained.  No evidence of acute fracture noted. ?-Minimal discussion regards to etiology of his symptoms.  I do think he has possible neuroma in the interspace versus bursa.  We will treat this with anti-inflammatories with the prescribed meloxicam and dispensed metatarsal offloading pads.  Continue with supportive shoe gear as well.  Also he is scheduled for nerve conduction test.  If this is normal then consider nerve biopsy. ? ?Trula Slade DPM ? ?   ? ?

## 2021-06-25 DIAGNOSIS — B9689 Other specified bacterial agents as the cause of diseases classified elsewhere: Secondary | ICD-10-CM | POA: Diagnosis not present

## 2021-06-25 DIAGNOSIS — I10 Essential (primary) hypertension: Secondary | ICD-10-CM | POA: Insufficient documentation

## 2021-06-25 DIAGNOSIS — J0141 Acute recurrent pansinusitis: Secondary | ICD-10-CM | POA: Diagnosis not present

## 2021-06-25 DIAGNOSIS — J060 Acute laryngopharyngitis: Secondary | ICD-10-CM | POA: Diagnosis not present

## 2021-06-25 DIAGNOSIS — J208 Acute bronchitis due to other specified organisms: Secondary | ICD-10-CM | POA: Diagnosis not present

## 2021-06-25 DIAGNOSIS — M129 Arthropathy, unspecified: Secondary | ICD-10-CM | POA: Insufficient documentation

## 2021-06-25 DIAGNOSIS — D3613 Benign neoplasm of peripheral nerves and autonomic nervous system of lower limb, including hip: Secondary | ICD-10-CM | POA: Insufficient documentation

## 2021-08-08 DIAGNOSIS — M5386 Other specified dorsopathies, lumbar region: Secondary | ICD-10-CM | POA: Diagnosis not present

## 2021-08-08 DIAGNOSIS — M9902 Segmental and somatic dysfunction of thoracic region: Secondary | ICD-10-CM | POA: Diagnosis not present

## 2021-08-08 DIAGNOSIS — M9903 Segmental and somatic dysfunction of lumbar region: Secondary | ICD-10-CM | POA: Diagnosis not present

## 2021-08-08 DIAGNOSIS — M5414 Radiculopathy, thoracic region: Secondary | ICD-10-CM | POA: Diagnosis not present

## 2021-08-09 DIAGNOSIS — M5386 Other specified dorsopathies, lumbar region: Secondary | ICD-10-CM | POA: Diagnosis not present

## 2021-08-09 DIAGNOSIS — M9902 Segmental and somatic dysfunction of thoracic region: Secondary | ICD-10-CM | POA: Diagnosis not present

## 2021-08-09 DIAGNOSIS — M9903 Segmental and somatic dysfunction of lumbar region: Secondary | ICD-10-CM | POA: Diagnosis not present

## 2021-08-09 DIAGNOSIS — M5414 Radiculopathy, thoracic region: Secondary | ICD-10-CM | POA: Diagnosis not present

## 2021-08-13 DIAGNOSIS — M9902 Segmental and somatic dysfunction of thoracic region: Secondary | ICD-10-CM | POA: Diagnosis not present

## 2021-08-13 DIAGNOSIS — M5386 Other specified dorsopathies, lumbar region: Secondary | ICD-10-CM | POA: Diagnosis not present

## 2021-08-13 DIAGNOSIS — M9903 Segmental and somatic dysfunction of lumbar region: Secondary | ICD-10-CM | POA: Diagnosis not present

## 2021-08-13 DIAGNOSIS — M5414 Radiculopathy, thoracic region: Secondary | ICD-10-CM | POA: Diagnosis not present

## 2021-08-20 ENCOUNTER — Encounter (HOSPITAL_BASED_OUTPATIENT_CLINIC_OR_DEPARTMENT_OTHER): Payer: Self-pay

## 2021-08-20 ENCOUNTER — Other Ambulatory Visit: Payer: Self-pay

## 2021-08-20 ENCOUNTER — Emergency Department (HOSPITAL_BASED_OUTPATIENT_CLINIC_OR_DEPARTMENT_OTHER): Payer: BC Managed Care – PPO

## 2021-08-20 ENCOUNTER — Inpatient Hospital Stay (HOSPITAL_BASED_OUTPATIENT_CLINIC_OR_DEPARTMENT_OTHER)
Admission: EM | Admit: 2021-08-20 | Discharge: 2021-08-25 | DRG: 292 | Disposition: A | Discharge status: Home / Self Care | Payer: BC Managed Care – PPO | Attending: Internal Medicine | Admitting: Internal Medicine

## 2021-08-20 ENCOUNTER — Emergency Department (HOSPITAL_BASED_OUTPATIENT_CLINIC_OR_DEPARTMENT_OTHER): Payer: BC Managed Care – PPO | Admitting: Radiology

## 2021-08-20 DIAGNOSIS — R7989 Other specified abnormal findings of blood chemistry: Secondary | ICD-10-CM | POA: Diagnosis not present

## 2021-08-20 DIAGNOSIS — I5033 Acute on chronic diastolic (congestive) heart failure: Principal | ICD-10-CM

## 2021-08-20 DIAGNOSIS — G4733 Obstructive sleep apnea (adult) (pediatric): Secondary | ICD-10-CM | POA: Diagnosis present

## 2021-08-20 DIAGNOSIS — Z6841 Body Mass Index (BMI) 40.0 and over, adult: Secondary | ICD-10-CM | POA: Diagnosis not present

## 2021-08-20 DIAGNOSIS — I4729 Other ventricular tachycardia: Secondary | ICD-10-CM | POA: Diagnosis not present

## 2021-08-20 DIAGNOSIS — I420 Dilated cardiomyopathy: Secondary | ICD-10-CM | POA: Diagnosis not present

## 2021-08-20 DIAGNOSIS — I472 Ventricular tachycardia, unspecified: Secondary | ICD-10-CM | POA: Diagnosis not present

## 2021-08-20 DIAGNOSIS — I509 Heart failure, unspecified: Secondary | ICD-10-CM | POA: Diagnosis not present

## 2021-08-20 DIAGNOSIS — I5031 Acute diastolic (congestive) heart failure: Secondary | ICD-10-CM | POA: Diagnosis not present

## 2021-08-20 DIAGNOSIS — I471 Supraventricular tachycardia, unspecified: Secondary | ICD-10-CM

## 2021-08-20 DIAGNOSIS — Z833 Family history of diabetes mellitus: Secondary | ICD-10-CM | POA: Diagnosis not present

## 2021-08-20 DIAGNOSIS — Z79899 Other long term (current) drug therapy: Secondary | ICD-10-CM | POA: Diagnosis not present

## 2021-08-20 DIAGNOSIS — R0609 Other forms of dyspnea: Secondary | ICD-10-CM | POA: Diagnosis not present

## 2021-08-20 DIAGNOSIS — I110 Hypertensive heart disease with heart failure: Secondary | ICD-10-CM | POA: Diagnosis not present

## 2021-08-20 DIAGNOSIS — I5021 Acute systolic (congestive) heart failure: Secondary | ICD-10-CM

## 2021-08-20 DIAGNOSIS — R778 Other specified abnormalities of plasma proteins: Secondary | ICD-10-CM | POA: Diagnosis not present

## 2021-08-20 DIAGNOSIS — R0602 Shortness of breath: Secondary | ICD-10-CM | POA: Diagnosis not present

## 2021-08-20 DIAGNOSIS — Z20822 Contact with and (suspected) exposure to covid-19: Secondary | ICD-10-CM | POA: Diagnosis not present

## 2021-08-20 DIAGNOSIS — R079 Chest pain, unspecified: Secondary | ICD-10-CM | POA: Diagnosis not present

## 2021-08-20 LAB — URINALYSIS, ROUTINE W REFLEX MICROSCOPIC
Bilirubin Urine: NEGATIVE
Glucose, UA: NEGATIVE mg/dL
Hgb urine dipstick: NEGATIVE
Ketones, ur: NEGATIVE mg/dL
Leukocytes,Ua: NEGATIVE
Nitrite: NEGATIVE
Protein, ur: NEGATIVE mg/dL
Specific Gravity, Urine: 1.007 (ref 1.005–1.030)
pH: 6.5 (ref 5.0–8.0)

## 2021-08-20 LAB — RESP PANEL BY RT-PCR (FLU A&B, COVID) ARPGX2
Influenza A by PCR: NEGATIVE
Influenza B by PCR: NEGATIVE
SARS Coronavirus 2 by RT PCR: NEGATIVE

## 2021-08-20 LAB — COMPREHENSIVE METABOLIC PANEL
ALT: 17 U/L (ref 0–44)
AST: 16 U/L (ref 15–41)
Albumin: 3.8 g/dL (ref 3.5–5.0)
Alkaline Phosphatase: 66 U/L (ref 38–126)
Anion gap: 11 (ref 5–15)
BUN: 15 mg/dL (ref 6–20)
CO2: 23 mmol/L (ref 22–32)
Calcium: 9.4 mg/dL (ref 8.9–10.3)
Chloride: 106 mmol/L (ref 98–111)
Creatinine, Ser: 1.21 mg/dL (ref 0.61–1.24)
GFR, Estimated: >60 mL/min (ref >60)
Glucose, Bld: 145 mg/dL — ABNORMAL HIGH (ref 70–99)
Potassium: 4.1 mmol/L (ref 3.5–5.1)
Sodium: 140 mmol/L (ref 135–145)
Total Bilirubin: 1 mg/dL (ref 0.3–1.2)
Total Protein: 6.7 g/dL (ref 6.5–8.1)

## 2021-08-20 LAB — CBC
HCT: 44.5 % (ref 39.0–52.0)
Hemoglobin: 14.7 g/dL (ref 13.0–17.0)
MCH: 29 pg (ref 26.0–34.0)
MCHC: 33 g/dL (ref 30.0–36.0)
MCV: 87.8 fL (ref 80.0–100.0)
Platelets: 159 10*3/uL (ref 150–400)
RBC: 5.07 MIL/uL (ref 4.22–5.81)
RDW: 13.3 % (ref 11.5–15.5)
WBC: 8.3 10*3/uL (ref 4.0–10.5)
nRBC: 0 % (ref 0.0–0.2)

## 2021-08-20 LAB — LIPASE, BLOOD: Lipase: 24 U/L (ref 11–51)

## 2021-08-20 LAB — TROPONIN I (HIGH SENSITIVITY)
Troponin I (High Sensitivity): 69 ng/L — ABNORMAL HIGH (ref <18)
Troponin I (High Sensitivity): 78 ng/L — ABNORMAL HIGH (ref <18)

## 2021-08-20 LAB — BRAIN NATRIURETIC PEPTIDE: B Natriuretic Peptide: 489.8 pg/mL — ABNORMAL HIGH (ref 0.0–100.0)

## 2021-08-20 LAB — D-DIMER, QUANTITATIVE: D-Dimer, Quant: 0.76 ug/mL-FEU — ABNORMAL HIGH (ref 0.00–0.50)

## 2021-08-20 LAB — CBG MONITORING, ED: Glucose-Capillary: 113 mg/dL — ABNORMAL HIGH (ref 70–99)

## 2021-08-20 MED ORDER — SODIUM CHLORIDE 0.9% FLUSH
3.0000 mL | Freq: Two times a day (BID) | INTRAVENOUS | Status: DC
  Administered 2021-08-20 – 2021-08-24 (×8): 3 mL via INTRAVENOUS

## 2021-08-20 MED ORDER — FUROSEMIDE 10 MG/ML IJ SOLN
60.0000 mg | Freq: Two times a day (BID) | INTRAMUSCULAR | Status: DC
  Administered 2021-08-20 – 2021-08-24 (×8): 60 mg via INTRAVENOUS
  Filled 2021-08-20 (×8): qty 6

## 2021-08-20 MED ORDER — ENOXAPARIN SODIUM 40 MG/0.4ML IJ SOSY: 40.0000 mg | PREFILLED_SYRINGE | Freq: Two times a day (BID) | INTRAMUSCULAR | Status: DC

## 2021-08-20 MED ORDER — ACETAMINOPHEN 325 MG PO TABS
650.0000 mg | ORAL_TABLET | ORAL | Status: DC | PRN
Start: 2021-08-20 — End: 2021-08-25
  Administered 2021-08-23: 650 mg via ORAL
  Filled 2021-08-20: qty 2

## 2021-08-20 MED ORDER — IOHEXOL 350 MG/ML SOLN
100.0000 mL | Freq: Once | INTRAVENOUS | Status: AC | PRN
  Administered 2021-08-20: 100 mL via INTRAVENOUS

## 2021-08-20 MED ORDER — METOPROLOL SUCCINATE ER 25 MG PO TB24
25.0000 mg | ORAL_TABLET | Freq: Every day | ORAL | Status: DC
  Administered 2021-08-21 – 2021-08-25 (×5): 25 mg via ORAL
  Filled 2021-08-20 (×5): qty 1

## 2021-08-20 MED ORDER — SODIUM CHLORIDE 0.9 % IV SOLN: 250.0000 mL | INTRAVENOUS | Status: DC | PRN

## 2021-08-20 MED ORDER — ONDANSETRON HCL 4 MG/2ML IJ SOLN: 4.0000 mg | Freq: Four times a day (QID) | INTRAMUSCULAR | Status: DC | PRN

## 2021-08-20 MED ORDER — SODIUM CHLORIDE 0.9% FLUSH: 3.0000 mL | INTRAVENOUS | Status: DC | PRN

## 2021-08-20 NOTE — ED Notes (Signed)
Pt ambulated to restroom. 

## 2021-08-20 NOTE — ED Triage Notes (Signed)
Patient here POV from Home.  Endorses SOB with Exertion and Lightheadedness that began a few days PTA. Also endorses Epigastric/Lower Chest Pain associated with Same. Pain is Pressure-Like and Non-Radiating.   No Fevers.   NAD Noted during Triage. A&Ox4. GCS 15. Ambulatory.

## 2021-08-20 NOTE — ED Notes (Signed)
Bed ready, 6E01 at [facility], [facility] called for transport

## 2021-08-20 NOTE — ED Provider Notes (Signed)
[facility] EM[facility] DE[facility] Provider Note   CSN: [id number] Arrival date & time: 08/20/21  1347     History  Chief Complaint  Patient presents with   Shortness of Breath    Zachary Fletcher is a 31 y.o. male with history of SVT currently on metoprolol who presents emergency department for exertional shortness of breath and lightheadedness that started a few days prior to arrival.  Patient states that he feels a tightness/pressure in his lower chest/epigastric area that is nonradiating.  He states that he is only able to walk short distances before he becomes short of breath and needs to either lean up against wall or sit down to recover.  He has had swelling in his feet over the last few weeks.  He denies fever, headache, vomiting, nausea, diarrhea, numbness and tingling.  He is not a smoker.  He is unsure of his immediate family's cardiac history but notes that diabetes runs in his family.  Patient follows with Dr. Stanford Breed for cardiology and was last seen 1 year ago with stable follow-up.  He had an echocardiogram done in October 2021 with an ejection fraction of 60 to 65%.  Shortness of Breath      Home Medications Prior to Admission medications   Medication Sig Start Date End Date Taking? Authorizing Provider  acetaminophen (TYLENOL) 500 MG tablet Take 500 mg by mouth every 6 (six) hours as needed.   Yes [provider]  ibuprofen (ADVIL) 200 MG tablet Take 200 mg by mouth every 6 (six) hours as needed.   Yes [provider]  metoprolol succinate (TOPROL-XL) 25 MG 24 hr tablet TAKE 1 TABLET BY MOUTH EVERY DAY 03/26/21  Yes Crenshaw, Denice Bors, MD  meloxicam (MOBIC) 15 MG tablet Take 1 tablet (15 mg total) by mouth daily. Patient not taking: Reported on 08/20/2021 06/21/21 06/21/22  Trula Slade, DPM      Allergies    Patient has no known allergies.    Review of Systems   Review of Systems  Respiratory:  Positive for shortness of breath.      Physical Exam Updated Vital Signs BP (!) 109/93 (BP Location: Right Arm)   Pulse 69   Temp (!) 97.3 F (36.3 C) (Temporal)   Resp (!) 23   Ht '6\' 2"'$  (1.88 m)   Wt (!) 199.1 kg   SpO2 98%   BMI 56.36 kg/m  Physical Exam Vitals and nursing note reviewed.  Constitutional:      General: He is not in acute distress.    Appearance: He is obese. He is not ill-appearing.  HENT:     Head: Atraumatic.  Eyes:     Conjunctiva/sclera: Conjunctivae normal.  Neck:     Vascular: No JVD.  Cardiovascular:     Rate and Rhythm: Normal rate and regular rhythm.     Pulses: Normal pulses.     Heart sounds: No murmur heard. Pulmonary:     Effort: Pulmonary effort is normal. Tachypnea present. No respiratory distress.     Breath sounds: Normal breath sounds.  Abdominal:     General: Abdomen is flat. There is no distension.     Palpations: Abdomen is soft.     Tenderness: There is no abdominal tenderness.  Musculoskeletal:        General: Normal range of motion.     Cervical back: Normal range of motion.     Right lower leg: 1+ Pitting Edema present.     Left lower leg:  1+ Pitting Edema present.     Comments: Left posterior calf tenderness.  1+ pitting edema of the ankles bilaterally.  Numerous varicose veins along the shin with venous stasis changes  Skin:    General: Skin is warm and dry.     Capillary Refill: Capillary refill takes less than 2 seconds.  Neurological:     General: No focal deficit present.     Mental Status: He is alert.  Psychiatric:        Mood and Affect: Mood normal.     ED Results / Procedures / Treatments   Labs (all labs ordered are listed, but only abnormal results are displayed) Labs Reviewed  COMPREHENSIVE METABOLIC PANEL - Abnormal; Notable for the following components:      Result Value   Glucose, Bld 145 (*)    All other components within normal limits  URINALYSIS, ROUTINE W REFLEX MICROSCOPIC - Abnormal; Notable for the following components:    Color, Urine COLORLESS (*)    All other components within normal limits  BRAIN NATRIURETIC PEPTIDE - Abnormal; Notable for the following components:   B Natriuretic Peptide 489.8 (*)    All other components within normal limits  D-DIMER, QUANTITATIVE - Abnormal; Notable for the following components:   D-Dimer, Quant 0.76 (*)    All other components within normal limits  CBG MONITORING, ED - Abnormal; Notable for the following components:   Glucose-Capillary 113 (*)    All other components within normal limits  TROPONIN I (HIGH SENSITIVITY) - Abnormal; Notable for the following components:   Troponin I (High Sensitivity) 69 (*)    All other components within normal limits  TROPONIN I (HIGH SENSITIVITY) - Abnormal; Notable for the following components:   Troponin I (High Sensitivity) 78 (*)    All other components within normal limits  RESP PANEL BY RT-PCR (FLU A&B, COVID) ARPGX2  LIPASE, BLOOD  CBC    EKG EKG Interpretation  Date/Time:  Tuesday August 20 2021 13:56:39 EDT Ventricular Rate:  77 PR Interval:  176 QRS Duration: 104 QT Interval:  406 QTC Calculation: 459 R Axis:   65 Text Interpretation: Normal sinus rhythm Biatrial enlargement Incomplete right bundle branch block Nonspecific ST and T wave abnormality Abnormal ECG When compared with ECG of 29-Nov-2019 10:11, T wave inversion less evident in Anterolateral leads Confirmed by Thamas Jaegers ([id number]) on 08/20/2021 5:29:05 PM  Radiology CT Angio Chest PE W and/or Wo Contrast  Result Date: 08/20/2021 CLINICAL DATA:  Pulmonary embolism (PE) suspected, positive D-dimer. Dyspnea with exertion. Lightheadedness. EXAM: CT ANGIOGRAPHY CHEST WITH CONTRAST TECHNIQUE: Multidetector CT imaging of the chest was performed using the standard protocol during bolus administration of intravenous contrast. Multiplanar CT image reconstructions and MIPs were obtained to evaluate the vascular anatomy. RADIATION DOSE REDUCTION: This exam was performed  according to the departmental dose-optimization program which includes automated exposure control, adjustment of the mA and/or kV according to patient size and/or use of iterative reconstruction technique. CONTRAST:  125m OMNIPAQUE IOHEXOL 350 MG/ML SOLN COMPARISON:  None Available. FINDINGS: Cardiovascular: Adequate opacification of the pulmonary arterial tree. No intraluminal filling defect identified to suggest acute pulmonary embolism. Central pulmonary arteries are of normal caliber. No significant coronary artery calcification. Cardiac size is mildly enlarged. No pericardial effusion. No significant atherosclerotic calcification within the thoracic aorta. No aortic aneurysm. Mediastinum/Nodes: No enlarged mediastinal, hilar, or axillary lymph nodes. Thyroid gland, trachea, and esophagus demonstrate no significant findings. Lungs/Pleura: 5 mm subpleural pulmonary nodule within the right upper lobe,  axial image # 70/7, is indeterminate but likely post inflammatory or post infectious in a patient of this age. The lungs are otherwise clear. No pneumothorax or pleural effusion. Central airways are widely patent. Upper Abdomen: No acute abnormality. Musculoskeletal: No chest wall abnormality. No acute or significant osseous findings. Review of the MIP images confirms the above findings. IMPRESSION: 1. No evidence of acute pulmonary embolism or other acute intrathoracic process. 2. Mild cardiomegaly. 3. 5 mm subpleural pulmonary nodule within the right upper lobe, indeterminate but likely post inflammatory or post infectious in a patient of this age. Electronically Signed   By: Fidela Salisbury M.D.   On: 08/20/2021 18:48   DG Chest 2 View  Result Date: 08/20/2021 CLINICAL DATA:  Chest pain and shortness of breath. EXAM: CHEST - 2 VIEW COMPARISON:  Chest two views 11/29/2019 FINDINGS: Cardiac silhouette is again mildly enlarged. Mediastinal contours within normal limits. The lungs are clear. No pleural effusion or  pneumothorax. No acute skeletal abnormality. IMPRESSION: Stable mildly enlarged cardiac silhouette.  No acute lung process. Electronically Signed   By: Yvonne Kendall M.D.   On: 08/20/2021 14:29    Procedures Procedures    Medications Ordered in ED Medications  iohexol (OMNIPAQUE) 350 MG/ML injection 100 mL (100 mLs Intravenous Contrast Given 08/20/21 1825)    ED Course/ Medical Decision Making/ A&P Clinical Course as of 08/20/21 2225  Tue Aug 20, 2021  1749 Spoke with Dr. Gasper Sells who agrees with current plan. If CTA negative, will admit to cardiology. If positive will admit to medicine [EC]    Clinical Course User Index [EC] Tonye Pearson, PA-C                           Medical Decision Making Amount and/or Complexity of Data Reviewed Labs: ordered. Radiology: ordered.  Risk Prescription drug management. Decision regarding hospitalization.   Social determinants of health:  Social History   Socioeconomic History   Marital status: Married    Spouse name: Not on file   Number of children: Not on file   Years of education: Not on file   Highest education level: Not on file  Occupational History   Not on file  Tobacco Use   Smoking status: Never   Smokeless tobacco: Never  Vaping Use   Vaping Use: Never used  Substance and Sexual Activity   Alcohol use: Yes    Comment: Rare   Drug use: Never   Sexual activity: Not on file  Other Topics Concern   Not on file  Social History Narrative   Right handed    Live with spouse    Social Determinants of Health   Financial Resource Strain: Not on file  Food Insecurity: Not on file  Transportation Needs: Not on file  Physical Activity: Not on file  Stress: Not on file  Social Connections: Not on file  Intimate Partner Violence: Not on file     Initial impression:  This patient presents to the ED for concern of exertional shortness of breath, bilateral leg swelling and chest discomfort, this involves an  extensive number of treatment options, and is a complaint that carries with it a high risk of complications and morbidity.   Differentials include ACS, PE, CHF, pneumonia, URI, bronchitis.   Comorbidities affecting care:  SVT  Additional history obtained: Cardiology records, patient sees Dr. Stanford Breed for SVT, echo and October 2021 with ejection fraction of 60 to 65%.  Lab  Tests  I Ordered, reviewed, and interpreted labs and EKG.  The pertinent results include:  Troponin is elevated at 69 and 78 Negative respiratory panel D-dimer elevated BNP nearly 500   Imaging Studies ordered:  I ordered imaging studies including  Chest x-ray with cardiomegaly, previously noted on imaging CTA negative for pulmonary embolism I independently visualized and interpreted imaging and I agree with the radiologist interpretation.   EKG: Normal sinus rhythm  Cardiac Monitoring:  The patient was maintained on a cardiac monitor.  I personally viewed and interpreted the cardiac monitored which showed an underlying rhythm of: Sinus rhythm  Consultations Obtained:  I requested consultation with cardiology and spoke with Dr. Gasper Sells,  and discussed lab and imaging findings as well as pertinent plan - they recommend: Wait for CTA to result.  If CTA negative, he will accept admission to cardiology.  If CTA positive, patient will likely need to be admitted to medicine.   ED Course/Re-evaluation: Presents with history and exam as described above.  Heart rate was normal, oxygenating at 100% on room air.  He is tachypneic to about 21 RPM.  I obtained labs and imaging which was concerning for elevated troponins, elevated BNP and elevated D-dimer.  I did obtain CTA to rule out PE which was negative.  Spoke with cardiology who agrees to admit patient for heart failure work-up.  Disposition:  After consideration of the diagnostic results, physical exam, history and the patients response to treatment feel that  the patent would benefit from admission.   Heart failure: Plan and management as described above. Discharged home in good condition.  Final diagnoses:  Acute heart failure, unspecified heart failure type [facility])    Rx / DC Orders ED Discharge Orders     None         Rodena Piety 08/20/21 2225    Luna Fuse, MD 08/26/21 1655

## 2021-08-20 NOTE — H&P (Signed)
Cardiology Admission History and Physical:   Patient ID: Zachary Fletcher MRN: [id number]; DOB: 18-Nov-1990   Admission date: 08/20/2021  PCP:  Flossie Buffy, NP   [facility] Providers Cardiologist:  None        Chief Complaint: SOB  Patient Profile:   Zachary Fletcher is a 31 y.o. male with PMH morbid obesity, h/o SVT- on metoprolol, OSA who is being seen 08/20/2021 for the evaluation of SOB.  History of Present Illness:  Zachary Fletcher is a 31 y.o. male with PMH morbid obesity, h/o SVT- on metoprolol, OSA who is being seen 08/20/2021 for the evaluation of SOB.  Patient reports exertional SOB and lightheadedness since  a week but got worse since last 3 days, associated with orthopnea, pnd and LE edema, as well as cough all the time with clear phelgm, at times greenish. No fevers, no sick contacts. He has exertional dyspnea (NYHA class III symptoms) Denies any chest pain per se, syncope, prior h/o MI, CHF, or does have family h/o heart dx at age 56. ER work up showed cardiomegaly, b/l increased vascular markings BNP 488, thus cards called for CHF admission  CXR: IMPRESSION: Stable mildly enlarged cardiac silhouette.  No acute lung process.  CT PE negative for PE  EKG: biatrial enlargement, incomplete RBBB, NSR Prior ECHO: 12/2019  1. TDS secondary to poor acustic window. Left ventricular ejection  fraction, by estimation, is 60 to 65%. The left ventricle has normal  function. The left ventricle has no regional wall motion abnormalities.  Left ventricular diastolic function could not   be evaluated.   2. Right ventricular systolic function was not well visualized. The right  ventricular size is normal.   3. The mitral valve is normal in structure. No evidence of mitral valve  regurgitation. No evidence of mitral stenosis.   4. The aortic valve is normal in structure. Aortic valve regurgitation is  not visualized. No aortic stenosis is present.   5. The inferior vena  cava is normal in size with greater than 50%  respiratory variability, suggesting right atrial pressure of 3 mmHg.     Past Medical History:  Diagnosis Date   Sleep apnea, obstructive    Tachycardia     Past Surgical History:  Procedure Laterality Date   No prior surgery       Medications Prior to Admission: Prior to Admission medications   Medication Sig Start Date End Date Taking? Authorizing Provider  acetaminophen (TYLENOL) 500 MG tablet Take 500 mg by mouth every 6 (six) hours as needed.   Yes [provider]  ibuprofen (ADVIL) 200 MG tablet Take 200 mg by mouth every 6 (six) hours as needed.   Yes [provider]  metoprolol succinate (TOPROL-XL) 25 MG 24 hr tablet TAKE 1 TABLET BY MOUTH EVERY DAY 03/26/21  Yes Crenshaw, Denice Bors, MD  meloxicam (MOBIC) 15 MG tablet Take 1 tablet (15 mg total) by mouth daily. Patient not taking: Reported on 08/20/2021 06/21/21 06/21/22  Trula Slade, DPM     Allergies:   No Known Allergies  Social History:   Social History   Socioeconomic History   Marital status: Married    Spouse name: Not on file   Number of children: Not on file   Years of education: Not on file   Highest education level: Not on file  Occupational History   Not on file  Tobacco Use   Smoking status: Never   Smokeless tobacco: Never  Vaping Use  Vaping Use: Never used  Substance and Sexual Activity   Alcohol use: Yes    Comment: Rare   Drug use: Never   Sexual activity: Not on file  Other Topics Concern   Not on file  Social History Narrative   Right handed    Live with spouse    Social Determinants of Health   Financial Resource Strain: Not on file  Food Insecurity: Not on file  Transportation Needs: Not on file  Physical Activity: Not on file  Stress: Not on file  Social Connections: Not on file  Intimate Partner Violence: Not on file    Family History:    The patient's family history includes Diabetes in an other family  member; Sleep apnea in his father.    ROS:  Please see the history of present illness.  All other ROS reviewed and negative.     Physical Exam/Data:   Vitals:   08/20/21 1704 08/20/21 1815 08/20/21 2131 08/20/21 2255  BP: 104/66 133/87 (!) 109/93 (!) 141/88  Pulse: 71 69 69 76  Resp: (!) 21 (!) 25 (!) 23 20  Temp:    98.1 F (36.7 C)  TempSrc:    Oral  SpO2: 100% 98% 98% 99%  Weight:    (!) 205.7 kg  Height:       No intake or output data in the 24 hours ending 08/20/21 2309    08/20/2021   10:55 PM 08/20/2021    1:54 PM 05/27/2021    1:45 PM  Last 3 Weights  Weight (lbs) 453 lb 8 oz 438 lb 15 oz 439 lb  Weight (kg) 205.706 kg 199.1 kg 199.129 kg     Body mass index is 58.23 kg/m.  General:  Well nourished, well developed, in no acute distress HEENT: normal Neck:  JVD++ Vascular: No carotid bruits; Distal pulses 2+ bilaterally   Cardiac:  normal S1, S2; RRR; no murmur  Lungs:  clear to auscultation bilaterally, no wheezing, rhonchi or rales  Abd: soft, nontender, no hepatomegaly  Ext: 2+ edema Musculoskeletal:  No deformities, BUE and BLE strength normal and equal Skin: warm and dry  Neuro:  CNs 2-12 intact, no focal abnormalities noted Psych:  Normal affect     Laboratory Data:  High Sensitivity Troponin:   Recent Labs  Lab 08/20/21 1403 08/20/21 1545  TROPONINIHS 69* 78*      Chemistry Recent Labs  Lab 08/20/21 1403  NA 140  K 4.1  CL 106  CO2 23  GLUCOSE 145*  BUN 15  CREATININE 1.21  CALCIUM 9.4  GFRNONAA >60  ANIONGAP 11    Recent Labs  Lab 08/20/21 1403  PROT 6.7  ALBUMIN 3.8  AST 16  ALT 17  ALKPHOS 66  BILITOT 1.0   Lipids No results for input(s): "CHOL", "TRIG", "HDL", "LABVLDL", "LDLCALC", "CHOLHDL" in the last 168 hours. Hematology Recent Labs  Lab 08/20/21 1403  WBC 8.3  RBC 5.07  HGB 14.7  HCT 44.5  MCV 87.8  MCH 29.0  MCHC 33.0  RDW 13.3  PLT 159   Thyroid No results for input(s): "TSH", "FREET4" in the last  168 hours. BNP Recent Labs  Lab 08/20/21 1401  BNP 489.8*    DDimer  Recent Labs  Lab 08/20/21 1401  DDIMER 0.76*     Radiology/Studies:  CT Angio Chest PE W and/or Wo Contrast  Result Date: 08/20/2021 CLINICAL DATA:  Pulmonary embolism (PE) suspected, positive D-dimer. Dyspnea with exertion. Lightheadedness. EXAM: CT ANGIOGRAPHY CHEST  WITH CONTRAST TECHNIQUE: Multidetector CT imaging of the chest was performed using the standard protocol during bolus administration of intravenous contrast. Multiplanar CT image reconstructions and MIPs were obtained to evaluate the vascular anatomy. RADIATION DOSE REDUCTION: This exam was performed according to the departmental dose-optimization program which includes automated exposure control, adjustment of the mA and/or kV according to patient size and/or use of iterative reconstruction technique. CONTRAST:  166m OMNIPAQUE IOHEXOL 350 MG/ML SOLN COMPARISON:  None Available. FINDINGS: Cardiovascular: Adequate opacification of the pulmonary arterial tree. No intraluminal filling defect identified to suggest acute pulmonary embolism. Central pulmonary arteries are of normal caliber. No significant coronary artery calcification. Cardiac size is mildly enlarged. No pericardial effusion. No significant atherosclerotic calcification within the thoracic aorta. No aortic aneurysm. Mediastinum/Nodes: No enlarged mediastinal, hilar, or axillary lymph nodes. Thyroid gland, trachea, and esophagus demonstrate no significant findings. Lungs/Pleura: 5 mm subpleural pulmonary nodule within the right upper lobe, axial image # 70/7, is indeterminate but likely post inflammatory or post infectious in a patient of this age. The lungs are otherwise clear. No pneumothorax or pleural effusion. Central airways are widely patent. Upper Abdomen: No acute abnormality. Musculoskeletal: No chest wall abnormality. No acute or significant osseous findings. Review of the MIP images confirms  the above findings. IMPRESSION: 1. No evidence of acute pulmonary embolism or other acute intrathoracic process. 2. Mild cardiomegaly. 3. 5 mm subpleural pulmonary nodule within the right upper lobe, indeterminate but likely post inflammatory or post infectious in a patient of this age. Electronically Signed   By: AFidela SalisburyM.D.   On: 08/20/2021 18:48   DG Chest 2 View  Result Date: 08/20/2021 CLINICAL DATA:  Chest pain and shortness of breath. EXAM: CHEST - 2 VIEW COMPARISON:  Chest two views 11/29/2019 FINDINGS: Cardiac silhouette is again mildly enlarged. Mediastinal contours within normal limits. The lungs are clear. No pleural effusion or pneumothorax. No acute skeletal abnormality. IMPRESSION: Stable mildly enlarged cardiac silhouette.  No acute lung process. Electronically Signed   By: RYvonne KendallM.D.   On: 08/20/2021 14:29     Assessment and Plan:   Acute decompensated CHF, HFpEF, 55-60% (new onset), NYHA class III symptoms Dilated CMP Elevated trop sec to above 4. Morbid obesity OSA on CPAP H/o SVT   Plan: - admit to cardiology team - start IV lasix '60mg'$  BID, goal UO 2-3 lts, if in adequate- increase lasix  - obtain ECHO in am - consider SGLT2i - continue home metoprolol xl '25mg'$  daily - BIPAP for OSA - aggressive risk factor modification, weight loss - full code.  Risk Assessment/Risk Scores:       New York Heart Association (NYHA) Functional Class NYHA Class III     Severity of Illness: The appropriate patient status for this patient is INPATIENT. Inpatient status is judged to be reasonable and necessary in order to provide the required intensity of service to ensure the patient's safety. The patient's presenting symptoms, physical exam findings, and initial radiographic and laboratory data in the context of their chronic comorbidities is felt to place them at high risk for further clinical deterioration. Furthermore, it is not anticipated that the patient will  be medically stable for discharge from the hospital within 2 midnights of admission.   * I certify that at the point of admission it is my clinical judgment that the patient will require inpatient hospital care spanning beyond 2 midnights from the point of admission due to high intensity of service, high risk for further deterioration  and high frequency of surveillance required.*   For questions or updates, please contact [place] Please consult [email or web address] for contact info under     Signed, Renae Fickle, MD  08/20/2021 11:09 PM

## 2021-08-20 NOTE — ED Notes (Signed)
Patient with h/o OSA. Patient states his CPAP settings at home are Max pressure 24, Min pressure 14. Patient states he would like to be placed on his unit around 2200-2230.

## 2021-08-21 ENCOUNTER — Inpatient Hospital Stay (HOSPITAL_COMMUNITY): Payer: BC Managed Care – PPO

## 2021-08-21 ENCOUNTER — Other Ambulatory Visit (HOSPITAL_COMMUNITY): Payer: Self-pay

## 2021-08-21 DIAGNOSIS — I5031 Acute diastolic (congestive) heart failure: Secondary | ICD-10-CM | POA: Diagnosis not present

## 2021-08-21 DIAGNOSIS — I509 Heart failure, unspecified: Secondary | ICD-10-CM | POA: Diagnosis not present

## 2021-08-21 LAB — CBC WITH DIFFERENTIAL/PLATELET
Abs Immature Granulocytes: 0.06 10*3/uL (ref 0.00–0.07)
Basophils Absolute: 0.1 10*3/uL (ref 0.0–0.1)
Basophils Relative: 1 %
Eosinophils Absolute: 0.1 10*3/uL (ref 0.0–0.5)
Eosinophils Relative: 1 %
HCT: 45.9 % (ref 39.0–52.0)
Hemoglobin: 16 g/dL (ref 13.0–17.0)
Immature Granulocytes: 1 %
Lymphocytes Relative: 18 %
Lymphs Abs: 1.8 10*3/uL (ref 0.7–4.0)
MCH: 30.3 pg (ref 26.0–34.0)
MCHC: 34.9 g/dL (ref 30.0–36.0)
MCV: 86.9 fL (ref 80.0–100.0)
Monocytes Absolute: 0.7 10*3/uL (ref 0.1–1.0)
Monocytes Relative: 7 %
Neutro Abs: 7.5 10*3/uL (ref 1.7–7.7)
Neutrophils Relative %: 72 %
Platelets: 180 10*3/uL (ref 150–400)
RBC: 5.28 MIL/uL (ref 4.22–5.81)
RDW: 13.2 % (ref 11.5–15.5)
WBC: 10.3 10*3/uL (ref 4.0–10.5)
nRBC: 0 % (ref 0.0–0.2)

## 2021-08-21 LAB — BASIC METABOLIC PANEL
Anion gap: 10 (ref 5–15)
BUN: 15 mg/dL (ref 6–20)
CO2: 25 mmol/L (ref 22–32)
Calcium: 9.2 mg/dL (ref 8.9–10.3)
Chloride: 105 mmol/L (ref 98–111)
Creatinine, Ser: 1.1 mg/dL (ref 0.61–1.24)
GFR, Estimated: >60 mL/min (ref >60)
Glucose, Bld: 106 mg/dL — ABNORMAL HIGH (ref 70–99)
Potassium: 3.6 mmol/L (ref 3.5–5.1)
Sodium: 140 mmol/L (ref 135–145)

## 2021-08-21 LAB — ECHOCARDIOGRAM COMPLETE
Height: 74 in
S' Lateral: 3.49 cm
Weight: 7054.4 oz

## 2021-08-21 LAB — HIV ANTIBODY (ROUTINE TESTING W REFLEX): HIV Screen 4th Generation wRfx: NONREACTIVE

## 2021-08-21 LAB — MAGNESIUM: Magnesium: 2.2 mg/dL (ref 1.7–2.4)

## 2021-08-21 MED ORDER — POTASSIUM CHLORIDE ER 10 MEQ PO TBCR
40.0000 meq | EXTENDED_RELEASE_TABLET | Freq: Every day | ORAL | Status: AC
  Administered 2021-08-21 – 2021-08-25 (×5): 40 meq via ORAL
  Filled 2021-08-21 (×9): qty 4

## 2021-08-21 MED ORDER — PERFLUTREN LIPID MICROSPHERE
1.0000 mL | INTRAVENOUS | Status: AC | PRN
  Administered 2021-08-21: 3 mL via INTRAVENOUS

## 2021-08-21 NOTE — Progress Notes (Addendum)
Progress Note  Patient Name: Zachary Fletcher Date of Encounter: 08/21/2021  [facility] Cardiologist: None   Subjective   Patient is feeling better this AM. Denies having any chest pain. SOB at rest has improved. Continues to feel some abdominal distention and mild ankle edema. Has not walked in halls yet so unsure if he has dyspnea on exertion.   Inpatient Medications    Scheduled Meds:  furosemide  60 mg Intravenous BID   metoprolol succinate  25 mg Oral Daily   sodium chloride flush  3 mL Intravenous Q12H   Continuous Infusions:  sodium chloride     PRN Meds: sodium chloride, acetaminophen, ondansetron (ZOFRAN) IV, sodium chloride flush   Vital Signs    Vitals:   08/20/21 2131 08/20/21 2255 08/21/21 0119 08/21/21 0454  BP: (!) 109/93 (!) 141/88  (!) 144/94  Pulse: 69 76 68 65  Resp: (!) 23 20 (!) 21 20  Temp:  98.1 F (36.7 C)  98.1 F (36.7 C)  TempSrc:  Oral  Oral  SpO2: 98% 99% 100% 100%  Weight:  (!) 205.7 kg  (!) 200 kg  Height:        Intake/Output Summary (Last 24 hours) at 08/21/2021 0658 Last data filed at 08/21/2021 0430 Gross per 24 hour  Intake 240 ml  Output 5400 ml  Net -5160 ml      08/21/2021    4:54 AM 08/20/2021   10:55 PM 08/20/2021    1:54 PM  Last 3 Weights  Weight (lbs) 440 lb 14.4 oz 453 lb 8 oz 438 lb 15 oz  Weight (kg) 199.991 kg 205.706 kg 199.1 kg      Telemetry    Sinus rhythm, PVCs, HR in the 60s-80s - Personally Reviewed  ECG    Sinus rhythm, biatrial enlargement, incomplete RBBB - Personally Reviewed  Physical Exam   GEN: No acute distress.  Resting comfortably in the bed  Neck: No JVD Cardiac: RRR, no murmurs, rubs, or gallops.  Respiratory: Distant heart sounds  GI: Soft, mildly distended, no tenderness to palpation  MS: Trace edema  Neuro:  Nonfocal  Psych: Normal affect   Labs    High Sensitivity Troponin:   Recent Labs  Lab 08/20/21 1403 08/20/21 1545  TROPONINIHS 69* 78*     Chemistry Recent  Labs  Lab 08/20/21 1403 08/21/21 0040  NA 140 140  K 4.1 3.6  CL 106 105  CO2 23 25  GLUCOSE 145* 106*  BUN 15 15  CREATININE 1.21 1.10  CALCIUM 9.4 9.2  MG  --  2.2  PROT 6.7  --   ALBUMIN 3.8  --   AST 16  --   ALT 17  --   ALKPHOS 66  --   BILITOT 1.0  --   GFRNONAA >60 >60  ANIONGAP 11 10    Lipids No results for input(s): "CHOL", "TRIG", "HDL", "LABVLDL", "LDLCALC", "CHOLHDL" in the last 168 hours.  Hematology Recent Labs  Lab 08/20/21 1403 08/21/21 0040  WBC 8.3 10.3  RBC 5.07 5.28  HGB 14.7 16.0  HCT 44.5 45.9  MCV 87.8 86.9  MCH 29.0 30.3  MCHC 33.0 34.9  RDW 13.3 13.2  PLT 159 180   Thyroid No results for input(s): "TSH", "FREET4" in the last 168 hours.  BNP Recent Labs  Lab 08/20/21 1401  BNP 489.8*    DDimer  Recent Labs  Lab 08/20/21 1401  DDIMER 0.76*     Radiology    CT  Angio Chest PE W and/or Wo Contrast  Result Date: 08/20/2021 CLINICAL DATA:  Pulmonary embolism (PE) suspected, positive D-dimer. Dyspnea with exertion. Lightheadedness. EXAM: CT ANGIOGRAPHY CHEST WITH CONTRAST TECHNIQUE: Multidetector CT imaging of the chest was performed using the standard protocol during bolus administration of intravenous contrast. Multiplanar CT image reconstructions and MIPs were obtained to evaluate the vascular anatomy. RADIATION DOSE REDUCTION: This exam was performed according to the departmental dose-optimization program which includes automated exposure control, adjustment of the mA and/or kV according to patient size and/or use of iterative reconstruction technique. CONTRAST:  167m OMNIPAQUE IOHEXOL 350 MG/ML SOLN COMPARISON:  None Available. FINDINGS: Cardiovascular: Adequate opacification of the pulmonary arterial tree. No intraluminal filling defect identified to suggest acute pulmonary embolism. Central pulmonary arteries are of normal caliber. No significant coronary artery calcification. Cardiac size is mildly enlarged. No pericardial effusion.  No significant atherosclerotic calcification within the thoracic aorta. No aortic aneurysm. Mediastinum/Nodes: No enlarged mediastinal, hilar, or axillary lymph nodes. Thyroid gland, trachea, and esophagus demonstrate no significant findings. Lungs/Pleura: 5 mm subpleural pulmonary nodule within the right upper lobe, axial image # 70/7, is indeterminate but likely post inflammatory or post infectious in a patient of this age. The lungs are otherwise clear. No pneumothorax or pleural effusion. Central airways are widely patent. Upper Abdomen: No acute abnormality. Musculoskeletal: No chest wall abnormality. No acute or significant osseous findings. Review of the MIP images confirms the above findings. IMPRESSION: 1. No evidence of acute pulmonary embolism or other acute intrathoracic process. 2. Mild cardiomegaly. 3. 5 mm subpleural pulmonary nodule within the right upper lobe, indeterminate but likely post inflammatory or post infectious in a patient of this age. Electronically Signed   By: AFidela SalisburyM.D.   On: 08/20/2021 18:48   DG Chest 2 View  Result Date: 08/20/2021 CLINICAL DATA:  Chest pain and shortness of breath. EXAM: CHEST - 2 VIEW COMPARISON:  Chest two views 11/29/2019 FINDINGS: Cardiac silhouette is again mildly enlarged. Mediastinal contours within normal limits. The lungs are clear. No pleural effusion or pneumothorax. No acute skeletal abnormality. IMPRESSION: Stable mildly enlarged cardiac silhouette.  No acute lung process. Electronically Signed   By: RYvonne KendallM.D.   On: 08/20/2021 14:29    Cardiac Studies     Patient Profile     31y.o. male with a past medical history of morbid obesity, h/o SVT on metoprolol, OSA who was seen on 6/13 for evaluation of SOB.   Assessment & Plan   Acute decompensated CHF  HFpEF - Most recent echocardiogram completed in 12/2019 and showed EF 60-65%, no regional wall motion abnormalities  - BNP elevated to 489.8  - CXR showed stable  mildly enlarged cardiac silhouette, no acute lung process  - So far, patient has only received one dose of IV lasix 60 mg-- output 5.4 L urine overnight. Now on IV lasix 60 mg IV BID   - Renal function stable, continue IV diuresis for now. Body habitus makes it difficult to determine volume status, so renal function and symptoms will be helpful in guiding diuresis.  - Echocardiogram this admission pending   - Discussed the importance of weight loss for improving overall heart health   Elevated Troponin  - hsTn mildly elevated at 69>>78  - Mild elevation with flat trend is not consistent with ACS. Patient denies any chest pain   - Patient will need ischemic evaluation if echocardiogram shows reduced EF   OSA on BiPap  - On Bipap at  home - Encouraged compliance, patient has been using at home and while admitted   History of SVT  - Continue metoprolol xl 25 mg daily  - Continue to monitor patient on telemetry   For questions or updates, please contact [place] Please consult [email or web address] for contact info under        Signed, Margie Billet, PA-C  08/21/2021, 6:58 AM    Patient seen and examined with KJ PA.  Agree as above, with the following exceptions and changes as noted below. Brisk diuresis thusfar, will continue and monitor response. Has OSA, will need to assess for RHF. Gen: NAD, CV: RRR, no murmurs, Lungs: clear, Abd: soft, Extrem: Warm, well perfused, 1+ edema, venous varicosities, Neuro/Psych: alert and oriented x 3, normal mood and affect. All available labs, radiology testing, previous records reviewed. Will review with patient and wife after echo complete to better understand nature of heart failure. Continue IV lasix.   Elouise Munroe, MD 08/21/21 11:14 AM

## 2021-08-21 NOTE — Progress Notes (Signed)
Patient able to place himself on V-60 Bipap for the night. Settings IPAP=19 EPAP=11 with oxygen set at 30%

## 2021-08-21 NOTE — Progress Notes (Addendum)
Heart Failure Stewardship Pharmacist Progress Note   PCP: Flossie Buffy, NP PCP-Cardiologist: None    HPI:  31 y.o. male with PMH morbid obesity, h/o SVT- on metoprolol, OSA who is being was admitted on 08/20/2021 for the evaluation of SOB.   Patient reported exertional SOB and lightheadedness since  a week but got worse since last 3 days prior to admission, associated with orthopnea, PND and LE edema, as well as cough all the time with clear phelgm, at times greenish. No fevers, no sick contacts. Denies any chest pain per se, syncope, prior h/o MI, CHF, or does have family h/o heart dx at age 65.  ER work up showed cardiomegaly on chest X-ray without any acute lung process, b/l increased vascular markings, CT negative for PE, EKG in NSR with incomplete RBBB, and BNP of 488 suggestive of CHF. Prior echo done 12/2019 with normal LVEF of 60-65%. Started on IV furosemide for diuresis.   Echo on 08/21/2021 pending.  Current HF Medications: Diuretic: furosemide 60 mg IV BID Beta Blocker: metoprolol succinate 25 mg daily  Prior to admission HF Medications: Beta blocker: metoprolol succinate 25 mg daily  Pertinent Lab Values: Serum creatinine 1.10, BUN 15, Potassium 3.6, Sodium 140, BNP 489.8, Magnesium 2.2, A1c pending,   Vital Signs: Weight: 440 lbs (admission weight: 453 lbs) Blood pressure: 138/81   Heart rate: 60-70s  I/O: not completely documented; net -6.6L  Medication Assistance / Insurance Benefits Check: Does the patient have prescription insurance?  Yes Type of insurance plan: [employer]  Outpatient Pharmacy:  Prior to admission outpatient pharmacy: [facility] Is the patient willing to use [facility] at discharge? Yes Is the patient willing to transition their outpatient pharmacy to utilize a [facility] outpatient pharmacy?   Pending  Assessment: 1. Acute on diastolic CHF (LVEF 74-25%), due to unclear etiology. NYHA class III symptoms. -New echo  pending  - Impressive UOP with current regimen. Still with edema and abdominal distension on exam. Continue furosemide 60 mg IV BID -Continue metoprolol succinate 25 mg IV daily with history of SVT -Keep K >4, MG > 2 -With K 3.6, and ongoing need for diuresis start spironolactone 25 mg daily to help maintain K with history of SVT   Plan: 1) Medication changes recommended at this time: -Check A1c, lipids -Start spironolactone 25 mg daily   -Further medication changes pending results of echo  2) Patient assistance: -copays: Nigel Mormon, Jardiance $25, Farxiga $15 -can use copay cards  3)  Education  - To be completed prior to discharge  Cathrine Muster, PharmD, [facility] PGY2 Cardiology Pharmacy Resident

## 2021-08-21 NOTE — Progress Notes (Signed)
Heart Failure Navigator Progress Note  Following this hospitalization to assess for HV TOC readiness.   Echo pending ? Last EF 60-65 % (12/2019) BNP 400's  Earnestine Leys, BSN, RN Heart Failure Leisure centre manager Chat Only

## 2021-08-22 ENCOUNTER — Other Ambulatory Visit (HOSPITAL_COMMUNITY): Payer: Self-pay

## 2021-08-22 ENCOUNTER — Encounter (HOSPITAL_COMMUNITY): Payer: Self-pay | Admitting: Cardiology

## 2021-08-22 DIAGNOSIS — I509 Heart failure, unspecified: Secondary | ICD-10-CM | POA: Diagnosis not present

## 2021-08-22 LAB — HEMOGLOBIN A1C
Hgb A1c MFr Bld: 5.9 % — ABNORMAL HIGH (ref 4.8–5.6)
Mean Plasma Glucose: 122.63 mg/dL

## 2021-08-22 LAB — MAGNESIUM: Magnesium: 2.2 mg/dL (ref 1.7–2.4)

## 2021-08-22 LAB — CBC WITH DIFFERENTIAL/PLATELET
Abs Immature Granulocytes: 0.04 10*3/uL (ref 0.00–0.07)
Basophils Absolute: 0 10*3/uL (ref 0.0–0.1)
Basophils Relative: 0 %
Eosinophils Absolute: 0.1 10*3/uL (ref 0.0–0.5)
Eosinophils Relative: 1 %
HCT: 48.7 % (ref 39.0–52.0)
Hemoglobin: 16.7 g/dL (ref 13.0–17.0)
Immature Granulocytes: 0 %
Lymphocytes Relative: 19 %
Lymphs Abs: 1.9 10*3/uL (ref 0.7–4.0)
MCH: 30 pg (ref 26.0–34.0)
MCHC: 34.3 g/dL (ref 30.0–36.0)
MCV: 87.4 fL (ref 80.0–100.0)
Monocytes Absolute: 1.1 10*3/uL — ABNORMAL HIGH (ref 0.1–1.0)
Monocytes Relative: 10 %
Neutro Abs: 7 10*3/uL (ref 1.7–7.7)
Neutrophils Relative %: 70 %
Platelets: 185 10*3/uL (ref 150–400)
RBC: 5.57 MIL/uL (ref 4.22–5.81)
RDW: 13.2 % (ref 11.5–15.5)
WBC: 10.1 10*3/uL (ref 4.0–10.5)
nRBC: 0 % (ref 0.0–0.2)

## 2021-08-22 LAB — BASIC METABOLIC PANEL
Anion gap: 13 (ref 5–15)
BUN: 18 mg/dL (ref 6–20)
CO2: 28 mmol/L (ref 22–32)
Calcium: 9.6 mg/dL (ref 8.9–10.3)
Chloride: 99 mmol/L (ref 98–111)
Creatinine, Ser: 1.21 mg/dL (ref 0.61–1.24)
GFR, Estimated: >60 mL/min (ref >60)
Glucose, Bld: 97 mg/dL (ref 70–99)
Potassium: 3.9 mmol/L (ref 3.5–5.1)
Sodium: 140 mmol/L (ref 135–145)

## 2021-08-22 LAB — LIPID PANEL
Cholesterol: 138 mg/dL (ref 0–200)
HDL: 34 mg/dL — ABNORMAL LOW (ref >40)
LDL Cholesterol: 85 mg/dL (ref 0–99)
Total CHOL/HDL Ratio: 4.1 RATIO
Triglycerides: 94 mg/dL (ref <150)
VLDL: 19 mg/dL (ref 0–40)

## 2021-08-22 MED ORDER — DAPAGLIFLOZIN PROPANEDIOL 10 MG PO TABS
10.0000 mg | ORAL_TABLET | Freq: Every day | ORAL | Status: DC
  Administered 2021-08-22 – 2021-08-25 (×4): 10 mg via ORAL
  Filled 2021-08-22 (×4): qty 1

## 2021-08-22 MED ORDER — MAGNESIUM OXIDE -MG SUPPLEMENT 400 (240 MG) MG PO TABS: 400.0000 mg | ORAL_TABLET | Freq: Every day | ORAL | Status: DC | PRN

## 2021-08-22 MED ORDER — GUAIFENESIN-DM 100-10 MG/5ML PO SYRP
5.0000 mL | ORAL_SOLUTION | ORAL | Status: DC | PRN
  Administered 2021-08-22: 5 mL via ORAL
  Filled 2021-08-22: qty 5

## 2021-08-22 NOTE — Progress Notes (Addendum)
Progress Note  Patient Name: Zachary Fletcher Date of Encounter: 08/23/2021  [facility] Cardiologist: None   Subjective   Patient is feeling well today, very mildly dizzy. Denies chest pain, palpitations, sob.   Inpatient Medications    Scheduled Meds:  dapagliflozin propanediol  10 mg Oral Daily   furosemide  60 mg Intravenous BID   metoprolol succinate  25 mg Oral Daily   potassium chloride  40 mEq Oral Daily   sodium chloride flush  3 mL Intravenous Q12H   Continuous Infusions:  sodium chloride     PRN Meds: sodium chloride, acetaminophen, guaiFENesin-dextromethorphan, magnesium oxide, ondansetron (ZOFRAN) IV, sodium chloride flush   Vital Signs    Vitals:   08/22/21 1957 08/23/21 0556 08/23/21 1011 08/23/21 1128  BP: 113/64 131/75 116/69 113/72  Pulse: 77 66 74 74  Resp: '20 16  20  '$ Temp: 97.8 F (36.6 C) 97.7 F (36.5 C)  97.7 F (36.5 C)  TempSrc: Oral Axillary  Oral  SpO2:  98%  94%  Weight:  (!) 191.8 kg    Height:        Intake/Output Summary (Last 24 hours) at 08/23/2021 1407 Last data filed at 08/23/2021 1129 Gross per 24 hour  Intake 660 ml  Output 3000 ml  Net -2340 ml      08/23/2021    5:56 AM 08/22/2021    6:20 AM 08/21/2021    4:54 AM  Last 3 Weights  Weight (lbs) 422 lb 12.8 oz 430 lb 1.6 oz 440 lb 14.4 oz  Weight (kg) 191.781 kg 195.092 kg 199.991 kg      Telemetry    Sinus rhythm, infrequent PVCs, one 7-beat episode of NSVT - Personally Reviewed  ECG    No new tracings  - Personally Reviewed  Physical Exam   GEN: No acute distress. Sitting comfortably in the chair   Neck: No JVD Cardiac: RRR, no murmurs, rubs, or gallops.  Respiratory: Diminished breath sounds throughout  GI: Soft, nontender, non-distended  MS: Trace edema in BLE  Neuro:  Nonfocal  Psych: Normal affect   Labs    High Sensitivity Troponin:   Recent Labs  Lab 08/20/21 1403 08/20/21 1545  TROPONINIHS 69* 78*     Chemistry Recent Labs  Lab  08/20/21 1403 08/21/21 0040 08/22/21 0155 08/23/21 0154  NA 140 140 140 137  K 4.1 3.6 3.9 3.8  CL 106 105 99 96*  CO2 '23 25 28 27  '$ GLUCOSE 145* 106* 97 97  BUN '15 15 18 20  '$ CREATININE 1.21 1.10 1.21 1.29*  CALCIUM 9.4 9.2 9.6 9.6  MG  --  2.2 2.2 2.4  PROT 6.7  --   --   --   ALBUMIN 3.8  --   --   --   AST 16  --   --   --   ALT 17  --   --   --   ALKPHOS 66  --   --   --   BILITOT 1.0  --   --   --   GFRNONAA >60 >60 >60 >60  ANIONGAP '11 10 13 14    '$ Lipids  Recent Labs  Lab 08/22/21 0155  CHOL 138  TRIG 94  HDL 34*  LDLCALC 85  CHOLHDL 4.1    Hematology Recent Labs  Lab 08/21/21 0040 08/22/21 0155 08/23/21 0154  WBC 10.3 10.1 11.1*  RBC 5.28 5.57 6.07*  HGB 16.0 16.7 18.1*  HCT 45.9 48.7 52.5*  MCV 86.9 87.4 86.5  MCH 30.3 30.0 29.8  MCHC 34.9 34.3 34.5  RDW 13.2 13.2 13.1  PLT 180 185 211   Thyroid No results for input(s): "TSH", "FREET4" in the last 168 hours.  BNP Recent Labs  Lab 08/20/21 1401  BNP 489.8*    DDimer  Recent Labs  Lab 08/20/21 1401  DDIMER 0.76*     Radiology    No results found.  Cardiac Studies   Echocardiogram 08/21/21  1. Extremely poor acoustic windows Definity used.   2. Left ventricular ejection fraction, by estimation, is 60 to 65%. The  left ventricle has normal function.   3. Right ventricular systolic function is normal. The right ventricular  size is normal.   4. MV is not well seen. . The mitral valve is grossly normal.   5. The aortic valve is normal in structure. Aortic valve regurgitation is  not visualized.   6. The inferior vena cava is normal in size with greater than 50%  respiratory variability, suggesting right atrial pressure of 3 mmHg.   Comparison(s): The left ventricular function is unchanged.   Patient Profile     31 y.o. male  with a past medical history of morbid obesity, h/o SVT on metoprolol, OSA who was seen on 6/13 for evaluation of SOB.   Assessment & Plan    Acute  decompensated CHF  HFpEF - Most recent echocardiogram completed in 12/2019 and showed EF 60-65%, no regional wall motion abnormalities  - BNP elevated to 489.8  - CXR showed stable mildly enlarged cardiac silhouette, no acute lung process - Echocardiogram this admission was an poor quality study- showed EF 60-65%, could not evaluate diastolic function or RV function. Dr. Margaretann Loveless personally took images yesterday-- RV and LV function grossly preserved, no significant TR, suspect RVSP in normal range  - Patient has been on IV lasix 60 mg BID-- output 5 L urine yesterday, currently net -16 L since admission  - Believes his dry weight is around 420 lbs. Was 453 on admission, now down to 423 lbs - On K supplementation while receiving IV lasix  - Creatinine very mildly increased today, up 1.29 from 1.21  Continue IV diuresis for now. Body habitus makes it difficult to determine volume status, so I am depending on renal function to guide diuresis. Will stop IV lasix when significant bump in creatinine - Suspect patient can be transitioned to oral torsemide soon, possibly tomorrow AM  - May need RHC when dry, will discuss with MD  - Continue metoprolol 25 mg BID given history of SVT  - Now on Farxiga 10 mg daily  - Discussed the importance of weight loss for improving overall heart health    NSVT  - Patient had a 7 beat run of NSVT on telemetry  - Continue metoprolol - Maintain K>4, mag >2   Elevated Troponin  - hsTn mildly elevated at 69>>78  - Mild elevation with flat trend is not consistent with ACS. Patient denies any chest pain   - Suspect demand given decompensated HF    OSA on BiPap  - On Bipap at home - Encouraged compliance, patient has been using at home and while admitted  - Suspect OSA is contributing to HF symptoms   History of SVT  - Continue metoprolol xl 25 mg daily  - Continue to monitor patient on telemetry  - Maintain K>4, mag>2  - on K supplementation during IV diuresis      For questions or  updates, please contact [place] Please consult [email or web address] for contact info under        Signed, Margie Billet, PA-C  08/23/2021, 2:07 PM    Patient seen and examined with KJ PA-C.  Agree as above, with the following exceptions and changes as noted below. Pt feeling better with less "head pressure and dizziness". Objective measures of volume challenging to assess. Gen: NAD, CV: RRR, no murmurs, Lungs: clear, Abd: soft, Extrem: Warm, well perfused, mild edema, Neuro/Psych: alert and oriented x 3, normal mood and affect. All available labs, radiology testing, previous records reviewed. Vigorously diuresing. Nearly at his dry weight of 420 lb, today at 422 lb. Likely transition to oral diuretic tomorrow - would suggest torsemide since strongly suspicious there is a right heart component to his HF. Recall echo showed normal biventricular function and I took additional bedside images confirming grossly normal RH function. Continue Bipap but avoid patient self adjusting settings. We started farxiga for HFpEF. Will order dietician consult given patient interest in improving. Would consider RHC as outpatient but patient is very worried and does not seem ready to consider this, he would like to try lifestyle management first.  Elouise Munroe, MD 08/23/21 10:18 PM

## 2021-08-22 NOTE — TOC Initial Note (Signed)
Transition of Care [facility]) - Initial/Assessment Note    Patient Details  Name: Zachary Fletcher MRN: [id number] Date of Birth: 1991/02/01  Transition of Care [facility]) CM/SW Contact:    Bethena Roys, RN Phone Number: 08/22/2021, 11:02 AM  Clinical Narrative: Patient presented for shortness of breath. PTA patient was independent from home with spouse. Patient states he has a BIPAP at home that he purchased online. Patient has PCP [place] and drives himself to appointments. Patient gets medications without any issues. Case Manager will continue to follow for additional transition of care needs.           Expected Discharge Plan: Home/Self Care Barriers to Discharge: No Barriers Identified   Patient Goals and CMS Choice Patient states their goals for this hospitalization and ongoing recovery are:: to return home   Choice offered to / list presented to : NA  Expected Discharge Plan and Services Expected Discharge Plan: Home/Self Care In-house Referral: NA Discharge Planning Services: CM Consult Post Acute Care Choice: Durable Medical Equipment (Bipap) Living arrangements for the past 2 months: Single Family Home                 DME Arranged: N/A         HH Arranged: NA      Prior Living Arrangements/Services Living arrangements for the past 2 months: Single Family Home Lives with:: Spouse Patient language and need for interpreter reviewed:: Yes Do you feel safe going back to the place where you live?: Yes      Need for Family Participation in Patient Care: No (Comment) Care giver support system in place?: No (comment) Current home services: DME (has Bipap at home) Criminal Activity/Legal Involvement Pertinent to Current Situation/Hospitalization: No - Comment as needed  Activities of Daily Living Home Assistive Devices/Equipment: None ADL Screening (condition at time of admission) Patient's cognitive ability adequate to safely complete daily activities?: Yes Is the  patient deaf or have difficulty hearing?: No Does the patient have difficulty seeing, even when wearing glasses/contacts?: No Does the patient have difficulty concentrating, remembering, or making decisions?: No Patient able to express need for assistance with ADLs?: Yes Does the patient have difficulty dressing or bathing?: No Independently performs ADLs?: Yes (appropriate for developmental age) Does the patient have difficulty walking or climbing stairs?: No Weakness of Legs: None Weakness of Arms/Hands: None  Permission Sought/Granted Permission sought to share information with : Family Supports, Case Manager       Emotional Assessment Appearance:: Appears stated age Attitude/Demeanor/Rapport: Engaged Affect (typically observed): Appropriate Orientation: : Oriented to Situation, Oriented to  Time, Oriented to Place, Oriented to Self Alcohol / Substance Use: Not Applicable Psych Involvement: No (comment)  Admission diagnosis:  CHF (congestive heart failure) ([place]) [I50.9] Acute heart failure, unspecified heart failure type [facility]) [I50.9] Patient Active Problem List   Diagnosis Date Noted   CHF (congestive heart failure) ([place]) 08/20/2021   Morbid obesity ([place]) 11/19/2020   Neuropathy 11/19/2020   OSA on CPAP 11/19/2020   Left groin pain 11/19/2020   Prediabetes 11/19/2020   PCP:  Flossie Buffy, NP Pharmacy:   [id number] [place], [place] - [address] [address]Phone: [phone]: [address] [facility]Phone: [phone]: [phone]  Readmission Risk Interventions     No data to display

## 2021-08-22 NOTE — Progress Notes (Signed)
Progress Note  Patient Name: Zachary Fletcher Date of Encounter: 08/22/2021  [facility] Cardiologist: None   Subjective   Feels well, diuresed aggressively.  Inpatient Medications    Scheduled Meds:  furosemide  60 mg Intravenous BID   metoprolol succinate  25 mg Oral Daily   potassium chloride  40 mEq Oral Daily   sodium chloride flush  3 mL Intravenous Q12H   Continuous Infusions:  sodium chloride     PRN Meds: sodium chloride, acetaminophen, ondansetron (ZOFRAN) IV, sodium chloride flush   Vital Signs    Vitals:   08/21/21 2236 08/22/21 0412 08/22/21 0620 08/22/21 0952  BP:   122/74 129/72  Pulse: 86 60 65 67  Resp: 18  20   Temp:   97.6 F (36.4 C)   TempSrc:   Oral   SpO2: 99% 98% 100%   Weight:   (!) 195.1 kg   Height:        Intake/Output Summary (Last 24 hours) at 08/22/2021 1210 Last data filed at 08/22/2021 1000 Gross per 24 hour  Intake 600 ml  Output 5550 ml  Net -4950 ml      08/22/2021    6:20 AM 08/21/2021    4:54 AM 08/20/2021   10:55 PM  Last 3 Weights  Weight (lbs) 430 lb 1.6 oz 440 lb 14.4 oz 453 lb 8 oz  Weight (kg) 195.092 kg 199.991 kg 205.706 kg      Telemetry    Sinus rhythm- Personally Reviewed  ECG    Sinus rhythm, biatrial enlargement, incomplete RBBB - Personally Reviewed  Physical Exam   GEN: No acute distress.   Neck: JVD challenging to assess Cardiac: RRR, no murmurs, rubs, or gallops.  Respiratory: Clear to auscultation bilaterally. GI: Soft, nontender, non-distended  MS: 1+ puffy edema; No deformity. Neuro:  Nonfocal  Psych: Normal affect    Labs    High Sensitivity Troponin:   Recent Labs  Lab 08/20/21 1403 08/20/21 1545  TROPONINIHS 69* 78*     Chemistry Recent Labs  Lab 08/20/21 1403 08/21/21 0040 08/22/21 0155  NA 140 140 140  K 4.1 3.6 3.9  CL 106 105 99  CO2 '23 25 28  '$ GLUCOSE 145* 106* 97  BUN '15 15 18  '$ CREATININE 1.21 1.10 1.21  CALCIUM 9.4 9.2 9.6  MG  --  2.2 2.2  PROT 6.7   --   --   ALBUMIN 3.8  --   --   AST 16  --   --   ALT 17  --   --   ALKPHOS 66  --   --   BILITOT 1.0  --   --   GFRNONAA >60 >60 >60  ANIONGAP '11 10 13    '$ Lipids  Recent Labs  Lab 08/22/21 0155  CHOL 138  TRIG 94  HDL 34*  LDLCALC 85  CHOLHDL 4.1    Hematology Recent Labs  Lab 08/20/21 1403 08/21/21 0040 08/22/21 0155  WBC 8.3 10.3 10.1  RBC 5.07 5.28 5.57  HGB 14.7 16.0 16.7  HCT 44.5 45.9 48.7  MCV 87.8 86.9 87.4  MCH 29.0 30.3 30.0  MCHC 33.0 34.9 34.3  RDW 13.3 13.2 13.2  PLT 159 180 185   Thyroid No results for input(s): "TSH", "FREET4" in the last 168 hours.  BNP Recent Labs  Lab 08/20/21 1401  BNP 489.8*    DDimer  Recent Labs  Lab 08/20/21 1401  DDIMER 0.76*     Radiology  ECHOCARDIOGRAM COMPLETE  Result Date: 08/21/2021    ECHOCARDIOGRAM REPORT   Patient Name:   Zachary Fletcher Date of Exam: 08/21/2021 Medical Rec #:  [id number]      Height:       74.0 in Accession #:    [id number]     Weight:       440.9 lb Date of Birth:  20-Jul-1990      BSA:          3.041 m Patient Age:    31 years       BP:           138/81 mmHg Patient Gender: M              HR:           70 bpm. Exam Location:  Inpatient Procedure: 2D Echo, Cardiac Doppler, Color Doppler and Intracardiac            Opacification Agent Indications:    CHF-Acute Diastolic B71.69  History:        Patient has prior history of Echocardiogram examinations, most                 recent 01/07/2020. CHF, Arrythmias:SVT; Risk Factors:Sleep                 Apnea. Dilated cardiomypathy. Elevated troponin.  Sonographer:    Darlina Sicilian RDCS Referring Phys: [id number] [place]  1. Extremely poor acoustic windows Definity used.  2. Left ventricular ejection fraction, by estimation, is 60 to 65%. The left ventricle has normal function.  3. Right ventricular systolic function is normal. The right ventricular size is normal.  4. MV is not well seen. . The mitral valve is grossly normal.  5. The  aortic valve is normal in structure. Aortic valve regurgitation is not visualized.  6. The inferior vena cava is normal in size with greater than 50% respiratory variability, suggesting right atrial pressure of 3 mmHg. Comparison(s): The left ventricular function is unchanged. FINDINGS  Left Ventricle: Left ventricular ejection fraction, by estimation, is 60 to 65%. The left ventricle has normal function. Definity contrast agent was given IV to delineate the left ventricular endocardial borders. The left ventricular internal cavity size was normal in size. Right Ventricle: The right ventricular size is normal. Right vetricular wall thickness was not assessed. Right ventricular systolic function is normal. Left Atrium: Left atrial size was normal in size. Right Atrium: Right atrial size was normal in size. Pericardium: There is no evidence of pericardial effusion. Mitral Valve: MV is not well seen. The mitral valve is grossly normal. Tricuspid Valve: The tricuspid valve is normal in structure. Tricuspid valve regurgitation is trivial. Aortic Valve: The aortic valve is normal in structure. Aortic valve regurgitation is not visualized. Pulmonic Valve: The pulmonic valve was normal in structure. Pulmonic valve regurgitation is not visualized. Aorta: The aortic root and ascending aorta are structurally normal, with no evidence of dilitation. Venous: The inferior vena cava is normal in size with greater than 50% respiratory variability, suggesting right atrial pressure of 3 mmHg. IAS/Shunts: No atrial level shunt detected by color flow Doppler.  LEFT VENTRICLE PLAX 2D LVIDd:         4.28 cm LVIDs:         3.49 cm LV PW:         1.49 cm LV IVS:        1.75 cm LVOT diam:     2.20 cm LV SV:  37 LV SV Index:   12 LVOT Area:     3.80 cm  RIGHT VENTRICLE RV S prime:     12.80 cm/s AORTIC VALVE LVOT Vmax:   91.60 cm/s LVOT Vmean:  68.500 cm/s LVOT VTI:    0.097 m  AORTA Ao Root diam: 2.90 cm Ao Asc diam:  2.90 cm  SHUNTS  Systemic VTI:  0.10 m Systemic Diam: 2.20 cm Dorris Carnes MD Electronically signed by Dorris Carnes MD Signature Date/Time: 08/21/2021/2:42:32 PM    Final    CT Angio Chest PE W and/or Wo Contrast  Result Date: 08/20/2021 CLINICAL DATA:  Pulmonary embolism (PE) suspected, positive D-dimer. Dyspnea with exertion. Lightheadedness. EXAM: CT ANGIOGRAPHY CHEST WITH CONTRAST TECHNIQUE: Multidetector CT imaging of the chest was performed using the standard protocol during bolus administration of intravenous contrast. Multiplanar CT image reconstructions and MIPs were obtained to evaluate the vascular anatomy. RADIATION DOSE REDUCTION: This exam was performed according to the departmental dose-optimization program which includes automated exposure control, adjustment of the mA and/or kV according to patient size and/or use of iterative reconstruction technique. CONTRAST:  131m OMNIPAQUE IOHEXOL 350 MG/ML SOLN COMPARISON:  None Available. FINDINGS: Cardiovascular: Adequate opacification of the pulmonary arterial tree. No intraluminal filling defect identified to suggest acute pulmonary embolism. Central pulmonary arteries are of normal caliber. No significant coronary artery calcification. Cardiac size is mildly enlarged. No pericardial effusion. No significant atherosclerotic calcification within the thoracic aorta. No aortic aneurysm. Mediastinum/Nodes: No enlarged mediastinal, hilar, or axillary lymph nodes. Thyroid gland, trachea, and esophagus demonstrate no significant findings. Lungs/Pleura: 5 mm subpleural pulmonary nodule within the right upper lobe, axial image # 70/7, is indeterminate but likely post inflammatory or post infectious in a patient of this age. The lungs are otherwise clear. No pneumothorax or pleural effusion. Central airways are widely patent. Upper Abdomen: No acute abnormality. Musculoskeletal: No chest wall abnormality. No acute or significant osseous findings. Review of the MIP images confirms  the above findings. IMPRESSION: 1. No evidence of acute pulmonary embolism or other acute intrathoracic process. 2. Mild cardiomegaly. 3. 5 mm subpleural pulmonary nodule within the right upper lobe, indeterminate but likely post inflammatory or post infectious in a patient of this age. Electronically Signed   By: AFidela SalisburyM.D.   On: 08/20/2021 18:48   DG Chest 2 View  Result Date: 08/20/2021 CLINICAL DATA:  Chest pain and shortness of breath. EXAM: CHEST - 2 VIEW COMPARISON:  Chest two views 11/29/2019 FINDINGS: Cardiac silhouette is again mildly enlarged. Mediastinal contours within normal limits. The lungs are clear. No pleural effusion or pneumothorax. No acute skeletal abnormality. IMPRESSION: Stable mildly enlarged cardiac silhouette.  No acute lung process. Electronically Signed   By: RYvonne KendallM.D.   On: 08/20/2021 14:29    Cardiac Studies     Patient Profile     31y.o. male with a past medical history of morbid obesity, h/o SVT on metoprolol, OSA who was seen on 6/13 for evaluation of SOB.   Assessment & Plan   Acute decompensated diastolic CHF  HFpEF - Most recent echocardiogram completed in 12/2019 and showed EF 60-65%, no regional wall motion abnormalities  - BNP elevated to 489.8  - CXR showed stable mildly enlarged cardiac silhouette, no acute lung process  - IV lasix 60 mg IV BID , 11 L UOP, dry weight 420 lb, down to 430 lb today, started 440 lb.  - Renal function stable, continue IV diuresis for now. Body habitus makes  it difficult to determine volume status, so renal function and symptoms will be helpful in guiding diuresis. Will consider RHC - Echocardiogram very challenging technically. I called the echo lab and had a sonographer join me at the bedside for additional images. RV and LV function grossly preserved. RA pressure is 3 mmHg today. No significant TR, suspect RVSP in normal range. - Discussed the importance of weight loss for improving overall heart  health  - needs to not self adjsut bipap settings. Needs sleep apnea follow up, I think this is the driver of his HF.   Elevated Troponin  - hsTn mildly elevated at 69>>78  - Mild elevation with flat trend is not consistent with ACS. Patient denies any chest pain   - can consider stress test/CCTA at some point but limited by possibility of artifact and quality of scan.   OSA on BiPap  - On Bipap at home - Encouraged compliance, patient has been using at home and while admitted   History of SVT  - Continue metoprolol xl 25 mg daily  - Continue to monitor patient on telemetry   For questions or updates, please contact [place] Please consult [email or web address] for contact info under        Signed, Elouise Munroe, MD  08/22/2021, 12:10 PM

## 2021-08-22 NOTE — Progress Notes (Signed)
Heart Failure Nurse Navigator Progress Note  PCP: Nche, Charlene Brooke, NP PCP-Cardiologist: Stanford Breed Admission Diagnosis: Acute heart failure Admitted from: Home  Presentation:   Zachary Fletcher presented with shortness of breath and lightheadedness, epigastric/ lower chest pain x a few days. BP 109/93, HR 69, BMI 56.36, +1 pitting edema to lower extremities, BNP 498.8, Troponin 78. IV lasix 60 mg BID, admitted.   Patient was educated about the sign and symptoms of heart failure, importance of daily weights, Diet/ fluid restrictions ( patient was unaware how sodium affects his body in terms of heart failure. Taking all his medications as prescribed, and attending all doctor appointments, patient was very interactive and asked a lot of good questions. He is very eager to do what is right and help himself. A hospital follow up appointment with HF TOC is scheduled for 09/06/21 @ 11 am.   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  Tobacco Use   Smoking status: Never   Smokeless tobacco: Never  Vaping Use   Vaping Use: Never used  Substance and Sexual Activity   Alcohol use: Yes    Comment: Rare   Drug use: Never   Sexual activity: Not on file  Other Topics Concern   Not on file  Social History Narrative   Right handed    Live with spouse    Social Determinants of Health   Financial Resource Strain: Low Risk  (08/22/2021)   Overall Financial Resource Strain (CARDIA)    Difficulty of Paying Living Expenses: Not hard at all  Food Insecurity: No Food Insecurity (08/22/2021)    Hunger Vital Sign    Worried About Running Out of Food in the Last Year: Never true    [place] in the Last Year: Never true  Transportation Needs: No Transportation Needs (08/22/2021)   PRAPARE - Hydrologist (Medical): No    Lack of Transportation (Non-Medical): No  Physical Activity: Not on file  Stress: Not on file  Social Connections: Not on file   Education Assessment and Provision:  Detailed education and instructions provided on heart failure disease management including the following:  Signs and symptoms of Heart Failure When to call the physician Importance of daily weights Low sodium diet Fluid restriction Medication management Anticipated future follow-up appointments  Patient education given on each of the above topics.  Patient acknowledges understanding via teach back method and acceptance of all instructions.  Education Materials:  "Living Better With Heart Failure" Booklet, HF zone tool, & Daily Weight Tracker Tool.  Patient has scale at home: yes Patient has pill box at home: No    High Risk Criteria for Readmission and/or Poor Patient Outcomes: Heart failure hospital admissions (last 6 months): 1  No Show rate: 10 %  Difficult social situation: No Demonstrates medication adherence: yes Primary Language: English Literacy level: Reading, writing, and comprehension.   Barriers of Care:   New diagnosis Diet/ fluid restrictions New medications Daily weights  Considerations/Referrals:   Referral made to Heart Failure  Pharmacist Stewardship: yes Referral made to Heart Failure CSW/NCM TOC: No Referral made to Heart & Vascular TOC clinic: yes. 09/06/21 @ 11 am.   Items for Follow-up on DC/TOC: New medications Diet/ fluid restrictions Daily weights   Earnestine Leys, BSN, Lawyer Chat Only

## 2021-08-22 NOTE — Progress Notes (Signed)
Patient has BIPAP at beside. Patient is able to place self on when ready. Pt will call if he needs any help.

## 2021-08-22 NOTE — Progress Notes (Signed)
Heart Failure Stewardship Pharmacist Progress Note   PCP: Flossie Buffy, NP PCP-Cardiologist: None    HPI:  31 y.o. male with PMH morbid obesity, h/o SVT- on metoprolol, OSA who is being was admitted on 08/20/2021 for the evaluation of SOB.   Patient reported exertional SOB and lightheadedness since  a week but got worse since last 3 days prior to admission, associated with orthopnea, PND and LE edema, as well as cough all the time with clear phelgm, at times greenish. No fevers, no sick contacts. Denies any chest pain per se, syncope, prior h/o MI, CHF, or does have family h/o heart dx at age 70.  ER work up showed cardiomegaly on chest X-ray without any acute lung process, b/l increased vascular markings, CT negative for PE, EKG in NSR with incomplete RBBB, and BNP of 488 suggestive of CHF. Prior echo done 12/2019 with normal LVEF of 60-65%. Started on IV furosemide for diuresis.   Echo on 08/21/2021 with stable LVEF of 60-65% and normal RV function.  Current HF Medications: Diuretic: furosemide 60 mg IV BID Beta Blocker: metoprolol succinate 25 mg daily  Prior to admission HF Medications: Beta blocker: metoprolol succinate 25 mg daily  Pertinent Lab Values: Serum creatinine 1.21, BUN 18, Potassium 3.9, Sodium 140, BNP 489.8, Magnesium 2.2, A1c 5.9%,   Vital Signs: Weight: 430 lbs (admission weight: 453 lbs) Blood pressure: 122/74   Heart rate: 60-70s  I/O: 11.6L UOP/24h; net -11.9L  Medication Assistance / Insurance Benefits Check: Does the patient have prescription insurance?  Yes Type of insurance plan: Film/video editor  Outpatient Pharmacy:  [facility] Is the patient willing to use [facility] at discharge? Yes Is the patient willing to transition their outpatient pharmacy to utilize a [facility] outpatient pharmacy?   Pending  Assessment: 1. Acute on diastolic CHF (LVEF 19-16%), due to unclear etiology. NYHA class III  symptoms. - Impressive UOP with current regimen. Still with edema and abdominal distension on exam. Continue furosemide 60 mg IV BID -Continue metoprolol succinate 25 mg daily with history of SVT -Keep K >4, MG > 2, agree with Kcl 22mq daily -With ongoing need for diuresis start spironolactone 25 mg daily to help maintain K with history of SVT -A1c 5.9%, preDM, and with HFpEF start Farxiga 10 mg daily -BP controlled, Scr up slightly, would hold off on Entresto, but start prior to discharge. Copay affordable.  -Would recommend cMRI and lamda-kappa light chains to further work-up HFpEF for possible amyloid as outpatient   Plan: 1) Medication changes recommended at this time: -Start spironolactone 25 mg daily   -Start Farxiga 10 mg daily  2) Patient assistance: -copays: ENigel Mormon Jardiance $25, Farxiga $15 -can use copay cards  3)  Education  - To be completed prior to discharge  JCathrine Muster PharmD, BChrist[facility] St [facility] Out[facility] Center Mid CountyPGY2 Cardiology Pharmacy Resident

## 2021-08-23 ENCOUNTER — Other Ambulatory Visit (HOSPITAL_COMMUNITY): Payer: Self-pay

## 2021-08-23 DIAGNOSIS — I509 Heart failure, unspecified: Secondary | ICD-10-CM | POA: Diagnosis not present

## 2021-08-23 LAB — BASIC METABOLIC PANEL
Anion gap: 14 (ref 5–15)
BUN: 20 mg/dL (ref 6–20)
CO2: 27 mmol/L (ref 22–32)
Calcium: 9.6 mg/dL (ref 8.9–10.3)
Chloride: 96 mmol/L — ABNORMAL LOW (ref 98–111)
Creatinine, Ser: 1.29 mg/dL — ABNORMAL HIGH (ref 0.61–1.24)
GFR, Estimated: >60 mL/min (ref >60)
Glucose, Bld: 97 mg/dL (ref 70–99)
Potassium: 3.8 mmol/L (ref 3.5–5.1)
Sodium: 137 mmol/L (ref 135–145)

## 2021-08-23 LAB — CBC WITH DIFFERENTIAL/PLATELET
Abs Immature Granulocytes: 0.05 10*3/uL (ref 0.00–0.07)
Basophils Absolute: 0 10*3/uL (ref 0.0–0.1)
Basophils Relative: 0 %
Eosinophils Absolute: 0.1 10*3/uL (ref 0.0–0.5)
Eosinophils Relative: 1 %
HCT: 52.5 % — ABNORMAL HIGH (ref 39.0–52.0)
Hemoglobin: 18.1 g/dL — ABNORMAL HIGH (ref 13.0–17.0)
Immature Granulocytes: 1 %
Lymphocytes Relative: 19 %
Lymphs Abs: 2.1 10*3/uL (ref 0.7–4.0)
MCH: 29.8 pg (ref 26.0–34.0)
MCHC: 34.5 g/dL (ref 30.0–36.0)
MCV: 86.5 fL (ref 80.0–100.0)
Monocytes Absolute: 1.1 10*3/uL — ABNORMAL HIGH (ref 0.1–1.0)
Monocytes Relative: 10 %
Neutro Abs: 7.7 10*3/uL (ref 1.7–7.7)
Neutrophils Relative %: 69 %
Platelets: 211 10*3/uL (ref 150–400)
RBC: 6.07 MIL/uL — ABNORMAL HIGH (ref 4.22–5.81)
RDW: 13.1 % (ref 11.5–15.5)
WBC: 11.1 10*3/uL — ABNORMAL HIGH (ref 4.0–10.5)
nRBC: 0 % (ref 0.0–0.2)

## 2021-08-23 LAB — MAGNESIUM: Magnesium: 2.4 mg/dL (ref 1.7–2.4)

## 2021-08-23 NOTE — Progress Notes (Signed)
Heart Failure Stewardship Pharmacist Progress Note   PCP: Flossie Buffy, NP PCP-Cardiologist: None    HPI:  31 y.o. male with PMH morbid obesity, h/o SVT- on metoprolol, OSA who is being was admitted on 08/20/2021 for the evaluation of SOB.   Patient reported exertional SOB and lightheadedness since  a week but got worse since last 3 days prior to admission, associated with orthopnea, PND and LE edema, as well as cough all the time with clear phelgm, at times greenish. No fevers, no sick contacts. Denies any chest pain per se, syncope, prior h/o MI, CHF, or does have family h/o heart dx at age 49.  ER work up showed cardiomegaly on chest X-ray without any acute lung process, b/l increased vascular markings, CT negative for PE, EKG in NSR with incomplete RBBB, and BNP of 488 suggestive of CHF. Prior echo done 12/2019 with normal LVEF of 60-65%. Started on IV furosemide for diuresis.   Echo on 08/21/2021 with stable LVEF of 60-65% and normal RV function.  Current HF Medications: Diuretic: furosemide 60 mg IV BID Beta Blocker: metoprolol succinate 25 mg daily SGLT2i: Farxiga 10 mg daily Other: Kcl 40 mEq daily x 5 doses  Prior to admission HF Medications: Beta blocker: metoprolol succinate 25 mg daily  Pertinent Lab Values: Serum creatinine 1.29, BUN 20, Potassium 3.8, Sodium 137, BNP 489.8, Magnesium 2.4, A1c 5.9%,   Vital Signs: Weight: 422 lbs (admission weight: 453 lbs) Blood pressure: 131/75   Heart rate: 60-70s  I/O: 5.3 L UOP/24h; net -17.4 L  Medication Assistance / Insurance Benefits Check: Does the patient have prescription insurance?  Yes Type of insurance plan: Film/video editor  Outpatient Pharmacy:  [facility] Is the patient willing to use [facility] at discharge? Yes Is the patient willing to transition their outpatient pharmacy to utilize a [facility] outpatient pharmacy?   Pending  Assessment: 1. Acute on  diastolic CHF (LVEF 78-24%), due to unclear etiology. NYHA class III symptoms. - Good UOP with current regimen. Dry weight is documented at 420 lbs. Could switch to oral diuretics today or 1 more day of IV diuresis. Would favor 1-2 more doses of furosemide 60 mg IV then switch to furosemide 40 mg daily 08/24/2021.  -Continue metoprolol succinate 25 mg daily with history of SVT -Keep K >4, MG > 2, agree with Kcl 72mq daily -With ongoing need for diuresis start spironolactone 25 mg daily to help maintain K with history of SVT -A1c 5.9%, preDM, and HFpEF continue Farxiga 10 mg daily -BP controlled, Scr up slightly, would hold off on Entresto, but could start prior to discharge. Copay affordable.  -OSA likely cause of HFpEF would recommend further work-up and possible referral to weight loss program. Could consider GLP1 for weight management if insurance would cover given preDM (requires PA).  -Would recommend cMRI and lamda-kappa light chains to further work-up HFpEF for possible amyloid as outpatient.   Plan: 1) Medication changes recommended at this time: -Start spironolactone 25 mg daily    2) Patient assistance: -copays: ENigel Mormon Jardiance $25, Farxiga $15 -PA required for GLP-1s -can use copay cards  3)  Education  - To be completed prior to discharge  JCathrine Muster PharmD, BTexas Center For Infectious DiseasePGY2 Cardiology Pharmacy Resident

## 2021-08-24 DIAGNOSIS — I5033 Acute on chronic diastolic (congestive) heart failure: Secondary | ICD-10-CM | POA: Diagnosis not present

## 2021-08-24 DIAGNOSIS — I4729 Other ventricular tachycardia: Secondary | ICD-10-CM

## 2021-08-24 LAB — BASIC METABOLIC PANEL
Anion gap: 13 (ref 5–15)
BUN: 22 mg/dL — ABNORMAL HIGH (ref 6–20)
CO2: 27 mmol/L (ref 22–32)
Calcium: 9.5 mg/dL (ref 8.9–10.3)
Chloride: 97 mmol/L — ABNORMAL LOW (ref 98–111)
Creatinine, Ser: 1.29 mg/dL — ABNORMAL HIGH (ref 0.61–1.24)
GFR, Estimated: >60 mL/min (ref >60)
Glucose, Bld: 86 mg/dL (ref 70–99)
Potassium: 3.9 mmol/L (ref 3.5–5.1)
Sodium: 137 mmol/L (ref 135–145)

## 2021-08-24 LAB — MAGNESIUM: Magnesium: 2.5 mg/dL — ABNORMAL HIGH (ref 1.7–2.4)

## 2021-08-24 MED ORDER — BENZONATATE 100 MG PO CAPS: 100.0000 mg | ORAL_CAPSULE | Freq: Three times a day (TID) | ORAL | Status: DC | PRN

## 2021-08-24 MED ORDER — TORSEMIDE 20 MG PO TABS
40.0000 mg | ORAL_TABLET | Freq: Two times a day (BID) | ORAL | Status: DC
  Administered 2021-08-24: 40 mg via ORAL
  Filled 2021-08-24 (×2): qty 2

## 2021-08-24 NOTE — Progress Notes (Signed)
Progress Note  Patient Name: Zachary Fletcher Date of Encounter: 08/24/2021  [facility] Cardiologist: None   Subjective   Denies any chest pain, SOB, palpitations  Inpatient Medications    Scheduled Meds:  dapagliflozin propanediol  10 mg Oral Daily   furosemide  60 mg Intravenous BID   metoprolol succinate  25 mg Oral Daily   potassium chloride  40 mEq Oral Daily   sodium chloride flush  3 mL Intravenous Q12H   Continuous Infusions:  sodium chloride     PRN Meds: sodium chloride, acetaminophen, guaiFENesin-dextromethorphan, magnesium oxide, ondansetron (ZOFRAN) IV, sodium chloride flush   Vital Signs    Vitals:   08/23/21 2006 08/24/21 0213 08/24/21 0215 08/24/21 0913  BP: 122/80  131/82 134/87  Pulse: 76  85 79  Resp: 18  18   Temp: 98 F (36.7 C)  97.7 F (36.5 C)   TempSrc: Oral  Oral   SpO2: 96%  95%   Weight:  (!) 191 kg    Height:        Intake/Output Summary (Last 24 hours) at 08/24/2021 1153 Last data filed at 08/24/2021 0900 Gross per 24 hour  Intake 600 ml  Output 3740 ml  Net -3140 ml       08/24/2021    2:13 AM 08/23/2021    5:56 AM 08/22/2021    6:20 AM  Last 3 Weights  Weight (lbs) 421 lb 1.6 oz 422 lb 12.8 oz 430 lb 1.6 oz  Weight (kg) 191.01 kg 191.781 kg 195.092 kg      Telemetry    NSR with PVCs - Personally Reviewed  ECG    No new tracings  - Personally Reviewed  Physical Exam   GEN: Well nourished, well developed in no acute distress HEENT: Normal NECK: No JVD; No carotid bruits LYMPHATICS: No lymphadenopathy CARDIAC:RRR, no murmurs, rubs, gallops RESPIRATORY:  Clear to auscultation without rales, wheezing or rhonchi  ABDOMEN: Soft, non-tender, non-distended MUSCULOSKELETAL:  No edema; No deformity  SKIN: Warm and dry NEUROLOGIC:  Alert and oriented x 3 PSYCHIATRIC:  Normal affect   Labs    High Sensitivity Troponin:   Recent Labs  Lab 08/20/21 1403 08/20/21 1545  TROPONINIHS 69* 78*      Chemistry Recent  Labs  Lab 08/20/21 1403 08/21/21 0040 08/22/21 0155 08/23/21 0154 08/24/21 0112  NA 140   < > 140 137 137  K 4.1   < > 3.9 3.8 3.9  CL 106   < > 99 96* 97*  CO2 23   < > '28 27 27  '$ GLUCOSE 145*   < > 97 97 86  BUN 15   < > 18 20 22*  CREATININE 1.21   < > 1.21 1.29* 1.29*  CALCIUM 9.4   < > 9.6 9.6 9.5  MG  --    < > 2.2 2.4 2.5*  PROT 6.7  --   --   --   --   ALBUMIN 3.8  --   --   --   --   AST 16  --   --   --   --   ALT 17  --   --   --   --   ALKPHOS 66  --   --   --   --   BILITOT 1.0  --   --   --   --   GFRNONAA >60   < > >60 >60 >60  ANIONGAP 11   < >  $'13 14 13   'g$ < > = values in this interval not displayed.     Lipids  Recent Labs  Lab 08/22/21 0155  CHOL 138  TRIG 94  HDL 34*  LDLCALC 85  CHOLHDL 4.1     Hematology Recent Labs  Lab 08/21/21 0040 08/22/21 0155 08/23/21 0154  WBC 10.3 10.1 11.1*  RBC 5.28 5.57 6.07*  HGB 16.0 16.7 18.1*  HCT 45.9 48.7 52.5*  MCV 86.9 87.4 86.5  MCH 30.3 30.0 29.8  MCHC 34.9 34.3 34.5  RDW 13.2 13.2 13.1  PLT 180 185 211    Thyroid No results for input(s): "TSH", "FREET4" in the last 168 hours.  BNP Recent Labs  Lab 08/20/21 1401  BNP 489.8*     DDimer  Recent Labs  Lab 08/20/21 1401  DDIMER 0.76*      Radiology    No results found.  Cardiac Studies   Echocardiogram 08/21/21  1. Extremely poor acoustic windows Definity used.   2. Left ventricular ejection fraction, by estimation, is 60 to 65%. The  left ventricle has normal function.   3. Right ventricular systolic function is normal. The right ventricular  size is normal.   4. MV is not well seen. . The mitral valve is grossly normal.   5. The aortic valve is normal in structure. Aortic valve regurgitation is  not visualized.   6. The inferior vena cava is normal in size with greater than 50%  respiratory variability, suggesting right atrial pressure of 3 mmHg.   Comparison(s): The left ventricular function is unchanged.   Patient Profile      31 y.o. male  with a past medical history of morbid obesity, h/o SVT on metoprolol, OSA who was seen on 6/13 for evaluation of SOB.   Assessment & Plan    Acute decompensated CHF  HFpEF - Most recent echocardiogram completed in 12/2019 and showed EF 60-65%, no regional wall motion abnormalities  - BNP elevated to 489.8  - CXR showed stable mildly enlarged cardiac silhouette, no acute lung process - Echocardiogram this admission was an poor quality study- showed EF 60-65%, RV and LV function grossly preserved, no significant TR, suspect RVSP in normal range  - Patient has been on IV lasix 60 mg BID - she put out 3.7L overnight and is net neg 19L - Believes his dry weight is around 420 lbs. Was 453 on admission, now down to 421 pounds  - On K supplementation while receiving IV lasix and potassium 3.9 today - Creatinine remained stable at 1.29 over the past 2 days  - Continue Toprol-XL 25 mg daily and Farxiga 10 mg daily - will transition to torsemide 40 mg twice daily today - May benefit from outpatient right heart cath   NSVT  - No further NSVT on telemetry - Continue Toprol-XL 25 mg daily - Maintain K>4, mag >2   Elevated Troponin  - hsTn mildly elevated at 69>>78  - Mild elevation with flat trend is not consistent with ACS. Patient denies any chest pain   - Suspect demand given decompensated HF    OSA on BiPap  - On Bipap at home - Encouraged compliance, patient has been using at home and while admitted  - Suspect OSA is contributing to HF symptoms   History of SVT  - No SVT on telemetry  - Continue Toprol-XL 25 mg daily - Continue to monitor patient on telemetry  - Maintain K>4, mag>2  - on K supplementation during  diuresis    I have spent a total of 30 minutes with patient reviewing 2D echo , telemetry, EKGs, labs and examining patient as well as establishing an assessment and plan that was discussed with the patient.  > 50% of time was spent in direct patient care.     For questions or updates, please contact [facility] Please consult [email or web address] for contact info under        Signed, Fransico Him, MD  08/24/2021, 11:53 AM

## 2021-08-24 NOTE — Discharge Instructions (Addendum)

## 2021-08-24 NOTE — Progress Notes (Addendum)
Nutrition Education Note  RD consulted for nutrition education regarding CHF.  Pt unavailable at time of visit. Attempted to speak with pt via call to hospital room phone x 2, however, unable to reach.   Case discussed with RN via secure chat. Pi is very eager for RD consult and requesting RD call pt back.   Spoke with pt over phone. Pt reports that he is receiving conflicting messages about which is the best diet for him to manage his CHF and weight. He admits that PTA he was consuming a lot of fast food and eating out a lot, but is committed to start cooking at home. RD affirmed this change and explained how cooking at home is the best way to control intake. Pt reports that he has followed a paleo/ keto diet in the past and felt better and did well with it; RD cautioned pt that following a very restrictive diet may be difficult to upkeep long term. RD focused education on CHF management and general overall healthy eating- choosing complex carbohydrates, lean proteins, fresh fruits and vegetables, as well as healthy beverage options. RD encouraged small changes that pt could make (reducing added sodium and refined sugars from diet) to help him meet his goals. Pt reports he feels better about managing at home after speaking with this RD. Reinforced importance of self-management to promote overall well being as well as prevent future hospitalizations.   RD provided "Low Sodium Nutrition Therapy" handout from the Academy of Nutrition and Dietetics. Attached to AVS/ discharge summary. RD also referred pt to [facility]'s Nutrition and Diabetes Education services for further reinforcement as an outpatient.   Reviewed patient's dietary recall. Provided examples on ways to decrease sodium intake in diet. Discouraged intake of processed foods and use of salt shaker. Encouraged fresh fruits and vegetables as well as whole grain sources of carbohydrates to maximize fiber intake.   RD discussed why it is  important for patient to adhere to diet recommendations, and emphasized the role of fluids, foods to avoid, and importance of weighing self daily. Teach back method used.  Expect fair to good compliance.  Body mass index is 54.07 kg/m. Pt meets criteria for obesity, class III based on current BMI .  Current diet order is Heart Healthy (liberalized to 2 gram for wider variety of meal selections and to better align with recommendations for nutritional management for CHF), patient is consuming approximately 100% of meals at this time. Labs and medications reviewed. No further nutrition interventions warranted at this time. RD contact information provided. If additional nutrition issues arise, please re-consult RD.   Loistine Chance, RD, LDN, [place] Registered Dietitian II Certified Diabetes Care and Education Specialist Please refer to [facility] for RD and/or RD on-call/weekend/after hours pager

## 2021-08-25 ENCOUNTER — Other Ambulatory Visit: Payer: Self-pay | Admitting: Physician Assistant

## 2021-08-25 DIAGNOSIS — I471 Supraventricular tachycardia, unspecified: Secondary | ICD-10-CM

## 2021-08-25 DIAGNOSIS — R7989 Other specified abnormal findings of blood chemistry: Secondary | ICD-10-CM

## 2021-08-25 DIAGNOSIS — I4729 Other ventricular tachycardia: Secondary | ICD-10-CM | POA: Diagnosis not present

## 2021-08-25 DIAGNOSIS — N179 Acute kidney failure, unspecified: Secondary | ICD-10-CM

## 2021-08-25 DIAGNOSIS — R778 Other specified abnormalities of plasma proteins: Secondary | ICD-10-CM

## 2021-08-25 DIAGNOSIS — I5033 Acute on chronic diastolic (congestive) heart failure: Secondary | ICD-10-CM | POA: Diagnosis not present

## 2021-08-25 LAB — BASIC METABOLIC PANEL
Anion gap: 13 (ref 5–15)
BUN: 22 mg/dL — ABNORMAL HIGH (ref 6–20)
CO2: 27 mmol/L (ref 22–32)
Calcium: 9 mg/dL (ref 8.9–10.3)
Chloride: 95 mmol/L — ABNORMAL LOW (ref 98–111)
Creatinine, Ser: 1.37 mg/dL — ABNORMAL HIGH (ref 0.61–1.24)
GFR, Estimated: >60 mL/min (ref >60)
Glucose, Bld: 91 mg/dL (ref 70–99)
Potassium: 3.9 mmol/L (ref 3.5–5.1)
Sodium: 135 mmol/L (ref 135–145)

## 2021-08-25 LAB — MAGNESIUM: Magnesium: 2.5 mg/dL — ABNORMAL HIGH (ref 1.7–2.4)

## 2021-08-25 MED ORDER — TORSEMIDE 20 MG PO TABS: 20.0000 mg | ORAL_TABLET | Freq: Two times a day (BID) | ORAL | 3 refills | Status: DC

## 2021-08-25 MED ORDER — TORSEMIDE 20 MG PO TABS
20.0000 mg | ORAL_TABLET | Freq: Two times a day (BID) | ORAL | Status: DC
  Administered 2021-08-25: 20 mg via ORAL

## 2021-08-25 MED ORDER — POTASSIUM CHLORIDE CRYS ER 20 MEQ PO TBCR: 20.0000 meq | EXTENDED_RELEASE_TABLET | Freq: Every day | ORAL | 3 refills | Status: DC

## 2021-08-25 MED ORDER — DAPAGLIFLOZIN PROPANEDIOL 10 MG PO TABS: 10.0000 mg | ORAL_TABLET | Freq: Every day | ORAL | 3 refills | Status: DC

## 2021-08-25 NOTE — Discharge Summary (Cosign Needed Addendum)
Discharge Summary    Patient ID: Zachary Fletcher MRN: [id number]; DOB: [birth year]  Admit date: 08/20/2021 Discharge date: 08/25/2021  PCP:  Flossie Buffy, NP   [facility] Providers Cardiologist:  Kirk Ruths, MD        Discharge Diagnoses    Principal Problem:   Acute on chronic diastolic CHF (congestive heart failure) ([place]) Active Problems:   Morbid obesity (HCC)   CHF (congestive heart failure) (HCC)   NSVT (nonsustained ventricular tachycardia) (HCC)   SVT (supraventricular tachycardia) (HCC)   Elevated troponin    Diagnostic Studies/Procedures    Echo 08/21/2021 1. Extremely poor acoustic windows Definity used.   2. Left ventricular ejection fraction, by estimation, is 60 to 65%. The  left ventricle has normal function.   3. Right ventricular systolic function is normal. The right ventricular  size is normal.   4. MV is not well seen. . The mitral valve is grossly normal.   5. The aortic valve is normal in structure. Aortic valve regurgitation is  not visualized.   6. The inferior vena cava is normal in size with greater than 50%  respiratory variability, suggesting right atrial pressure of 3 mmHg.   Comparison(s): The left ventricular function is unchanged.   _____________   History of Present Illness     Zachary Fletcher is a 31 y.o. male with PMH morbid obesity, h/o SVT- on metoprolol, OSA who is being seen 08/20/2021 for the evaluation of SOB.   Patient reports exertional SOB and lightheadedness since  a week but got worse since last 3 days, associated with orthopnea, pnd and LE edema, as well as cough all the time with clear phelgm, at times greenish. No fevers, no sick contacts. He has exertional dyspnea (NYHA class III symptoms) Denies any chest pain per se, syncope, prior h/o MI, CHF, or does have family h/o heart dx at age 21. ER work up showed cardiomegaly, b/l increased vascular markings BNP 488, thus cards called for CHF admission    CXR: IMPRESSION: Stable mildly enlarged cardiac silhouette.  No acute lung process.   CT PE negative for PE   EKG: biatrial enlargement, incomplete RBBB, NSR Prior ECHO: 12/2019  1. TDS secondary to poor acustic window. Left ventricular ejection  fraction, by estimation, is 60 to 65%. The left ventricle has normal  function. The left ventricle has no regional wall motion abnormalities.  Left ventricular diastolic function could not   be evaluated.   2. Right ventricular systolic function was not well visualized. The right  ventricular size is normal.   3. The mitral valve is normal in structure. No evidence of mitral valve  regurgitation. No evidence of mitral stenosis.   4. The aortic valve is normal in structure. Aortic valve regurgitation is  not visualized. No aortic stenosis is present.   5. The inferior vena cava is normal in size with greater than 50%  respiratory variability, suggesting right atrial pressure of 3 mmHg.     Hospital Course     Consultants: Dietician   Patient's initial presentation was consistent with acute decompensated diastolic heart failure.  He was admitted to cardiology service to undergo aggressive IV diuresis.  Initial admission weight was 140 pounds.  Mild elevation of the serial troponin was flat and consistent with demand ischemia.  Repeat echocardiogram obtained on 08/21/2021 demonstrated EF 60 to 65%, no significant valve issue.  Patient had a great urine output on 60 mg twice a day of IV Lasix.  By the time  of discharge, I/O was -20 L.  Weight has decreased to 418.5 pounds. Dr. Margaretann Loveless initially recommended to consider right heart cath as outpatient, however patient was hesitant to consider this and would like to try lifestyle management first.  He was started on Farxiga for HFpEF.  Dietitian was consulted.  He was transitioned to 40 mg twice a day of torsemide on 08/24/2021.   Patient was seen in the morning of 08/25/2021, he appears to be euvolemic.   Creatinine bumped up to 1.37.  Torsemide was decreased to 20 mg twice a day.  Dr. Radford Pax also emphasized that the patient may benefit from outpatient right heart cath.  Aggressive weight loss is also recommended as well.  He will need a basic metabolic panel on Tuesday before following up in 1 week.  Outpatient transition of care CHF clinic has been scheduled as well.   Did the patient have an acute coronary syndrome (MI, NSTEMI, STEMI, etc) this admission?:  No                               Did the patient have a percutaneous coronary intervention (stent / angioplasty)?:  No.      _____________  Discharge Vitals Blood pressure (!) 130/99, pulse 82, temperature 97.7 F (36.5 C), temperature source Oral, resp. rate 16, height '6\' 2"'$  (1.88 m), weight (!) 189.8 kg, SpO2 93 %.  Filed Weights   08/23/21 0556 08/24/21 0213 08/25/21 0656  Weight: (!) 191.8 kg (!) 191 kg (!) 189.8 kg    Labs & Radiologic Studies    CBC Recent Labs    08/23/21 0154  WBC 11.1*  NEUTROABS 7.7  HGB 18.1*  HCT 52.5*  MCV 86.5  PLT 132   Basic Metabolic Panel Recent Labs    08/24/21 0112 08/25/21 0122  NA 137 135  K 3.9 3.9  CL 97* 95*  CO2 27 27  GLUCOSE 86 91  BUN 22* 22*  CREATININE 1.29* 1.37*  CALCIUM 9.5 9.0  MG 2.5* 2.5*   Liver Function Tests No results for input(s): "AST", "ALT", "ALKPHOS", "BILITOT", "PROT", "ALBUMIN" in the last 72 hours. No results for input(s): "LIPASE", "AMYLASE" in the last 72 hours. High Sensitivity Troponin:   Recent Labs  Lab 08/20/21 1403 08/20/21 1545  TROPONINIHS 69* 78*    BNP Invalid input(s): "POCBNP" D-Dimer No results for input(s): "DDIMER" in the last 72 hours. Hemoglobin A1C No results for input(s): "HGBA1C" in the last 72 hours. Fasting Lipid Panel No results for input(s): "CHOL", "HDL", "LDLCALC", "TRIG", "CHOLHDL", "LDLDIRECT" in the last 72 hours. Thyroid Function Tests No results for input(s): "TSH", "T4TOTAL", "T3FREE", "THYROIDAB"  in the last 72 hours.  Invalid input(s): "FREET3" _____________  ECHOCARDIOGRAM COMPLETE  Result Date: 08/21/2021    ECHOCARDIOGRAM REPORT   Patient Name:   Zachary Fletcher Date of Exam: 08/21/2021 Medical Rec #:  [id number]      Height:       74.0 in Accession #:    [id number]     Weight:       440.9 lb Date of Birth:  September 21, 1990      BSA:          3.041 m Patient Age:    31 years       BP:           138/81 mmHg Patient Gender: M  HR:           70 bpm. Exam Location:  Inpatient Procedure: 2D Echo, Cardiac Doppler, Color Doppler and Intracardiac            Opacification Agent Indications:    CHF-Acute Diastolic M84.13  History:        Patient has prior history of Echocardiogram examinations, most                 recent 01/07/2020. CHF, Arrythmias:SVT; Risk Factors:Sleep                 Apnea. Dilated cardiomypathy. Elevated troponin.  Sonographer:    Darlina Sicilian RDCS Referring Phys: [id number] [place]  1. Extremely poor acoustic windows Definity used.  2. Left ventricular ejection fraction, by estimation, is 60 to 65%. The left ventricle has normal function.  3. Right ventricular systolic function is normal. The right ventricular size is normal.  4. MV is not well seen. . The mitral valve is grossly normal.  5. The aortic valve is normal in structure. Aortic valve regurgitation is not visualized.  6. The inferior vena cava is normal in size with greater than 50% respiratory variability, suggesting right atrial pressure of 3 mmHg. Comparison(s): The left ventricular function is unchanged. FINDINGS  Left Ventricle: Left ventricular ejection fraction, by estimation, is 60 to 65%. The left ventricle has normal function. Definity contrast agent was given IV to delineate the left ventricular endocardial borders. The left ventricular internal cavity size was normal in size. Right Ventricle: The right ventricular size is normal. Right vetricular wall thickness was not assessed. Right  ventricular systolic function is normal. Left Atrium: Left atrial size was normal in size. Right Atrium: Right atrial size was normal in size. Pericardium: There is no evidence of pericardial effusion. Mitral Valve: MV is not well seen. The mitral valve is grossly normal. Tricuspid Valve: The tricuspid valve is normal in structure. Tricuspid valve regurgitation is trivial. Aortic Valve: The aortic valve is normal in structure. Aortic valve regurgitation is not visualized. Pulmonic Valve: The pulmonic valve was normal in structure. Pulmonic valve regurgitation is not visualized. Aorta: The aortic root and ascending aorta are structurally normal, with no evidence of dilitation. Venous: The inferior vena cava is normal in size with greater than 50% respiratory variability, suggesting right atrial pressure of 3 mmHg. IAS/Shunts: No atrial level shunt detected by color flow Doppler.  LEFT VENTRICLE PLAX 2D LVIDd:         4.28 cm LVIDs:         3.49 cm LV PW:         1.49 cm LV IVS:        1.75 cm LVOT diam:     2.20 cm LV SV:         37 LV SV Index:   12 LVOT Area:     3.80 cm  RIGHT VENTRICLE RV S prime:     12.80 cm/s AORTIC VALVE LVOT Vmax:   91.60 cm/s LVOT Vmean:  68.500 cm/s LVOT VTI:    0.097 m  AORTA Ao Root diam: 2.90 cm Ao Asc diam:  2.90 cm  SHUNTS Systemic VTI:  0.10 m Systemic Diam: 2.20 cm Dorris Carnes MD Electronically signed by Dorris Carnes MD Signature Date/Time: 08/21/2021/2:42:32 PM    Final    CT Angio Chest PE W and/or Wo Contrast  Result Date: 08/20/2021 CLINICAL DATA:  Pulmonary embolism (PE) suspected, positive D-dimer. Dyspnea with exertion. Lightheadedness. EXAM: CT  ANGIOGRAPHY CHEST WITH CONTRAST TECHNIQUE: Multidetector CT imaging of the chest was performed using the standard protocol during bolus administration of intravenous contrast. Multiplanar CT image reconstructions and MIPs were obtained to evaluate the vascular anatomy. RADIATION DOSE REDUCTION: This exam was performed according to  the departmental dose-optimization program which includes automated exposure control, adjustment of the mA and/or kV according to patient size and/or use of iterative reconstruction technique. CONTRAST:  170m OMNIPAQUE IOHEXOL 350 MG/ML SOLN COMPARISON:  None Available. FINDINGS: Cardiovascular: Adequate opacification of the pulmonary arterial tree. No intraluminal filling defect identified to suggest acute pulmonary embolism. Central pulmonary arteries are of normal caliber. No significant coronary artery calcification. Cardiac size is mildly enlarged. No pericardial effusion. No significant atherosclerotic calcification within the thoracic aorta. No aortic aneurysm. Mediastinum/Nodes: No enlarged mediastinal, hilar, or axillary lymph nodes. Thyroid gland, trachea, and esophagus demonstrate no significant findings. Lungs/Pleura: 5 mm subpleural pulmonary nodule within the right upper lobe, axial image # 70/7, is indeterminate but likely post inflammatory or post infectious in a patient of this age. The lungs are otherwise clear. No pneumothorax or pleural effusion. Central airways are widely patent. Upper Abdomen: No acute abnormality. Musculoskeletal: No chest wall abnormality. No acute or significant osseous findings. Review of the MIP images confirms the above findings. IMPRESSION: 1. No evidence of acute pulmonary embolism or other acute intrathoracic process. 2. Mild cardiomegaly. 3. 5 mm subpleural pulmonary nodule within the right upper lobe, indeterminate but likely post inflammatory or post infectious in a patient of this age. Electronically Signed   By: AFidela SalisburyM.D.   On: 08/20/2021 18:48   DG Chest 2 View  Result Date: 08/20/2021 CLINICAL DATA:  Chest pain and shortness of breath. EXAM: CHEST - 2 VIEW COMPARISON:  Chest two views 11/29/2019 FINDINGS: Cardiac silhouette is again mildly enlarged. Mediastinal contours within normal limits. The lungs are clear. No pleural effusion or  pneumothorax. No acute skeletal abnormality. IMPRESSION: Stable mildly enlarged cardiac silhouette.  No acute lung process. Electronically Signed   By: RYvonne KendallM.D.   On: 08/20/2021 14:29    Disposition   Pt is being discharged home today in good condition.  Follow-up Plans & Appointments     Follow-up Information     [facility]. Go to.   Specialty: Cardiology Why: Hospital follow  up on 6/30 at 11:00AM. PLEASE bring a curent medication list to appointment. FREE valet parking, [address], [address] Contact information: [id number]       CLelon Perla MD Follow up.   Specialty: Cardiology Why: please obtain BMET blood work (nonfasting) at our [facility] on Tue, no appt is needed for the blood work. Our schedule will contact you to arrange follow up with Dr. CStanford Breedor his APP. Please give u[place]aa call if you do not hear from our scheduler in 3 business days Contact information: [phone]               Discharge Instructions     Amb Referral to HF Clinic   Complete by: As directed    Amb Referral to Nutrition and Diabetic Education   Complete by: As directed    Amb Referral to Phase II Cardiac  Rehab   Complete by: As directed    Diagnosis: Heart Failure (see criteria below if ordering Phase II)  Heart Failure Type: Chronic Diastolic   After initial evaluation and assessments completed: Virtual Based Care may be provided alone or in conjunction with Phase 2 Cardiac Rehab based on patient barriers.: Yes   Diet - low sodium heart healthy   Complete by: As directed    Increase activity slowly   Complete by: As directed        Discharge Medications   Allergies as of 08/25/2021   No Known Allergies      Medication List     TAKE these medications     acetaminophen 500 MG tablet Commonly known as: TYLENOL Take 500 mg by mouth every 6 (six) hours as needed.   dapagliflozin propanediol 10 MG Tabs tablet Commonly known as: FARXIGA Take 1 tablet (10 mg total) by mouth daily. Start taking on: August 26, 2021   ibuprofen 200 MG tablet Commonly known as: ADVIL Take 200 mg by mouth every 6 (six) hours as needed.   meloxicam 15 MG tablet Commonly known as: MOBIC Take 1 tablet (15 mg total) by mouth daily.   metoprolol succinate 25 MG 24 hr tablet Commonly known as: TOPROL-XL TAKE 1 TABLET BY MOUTH EVERY DAY   potassium chloride SA 20 MEQ tablet Commonly known as: KLOR-CON M Take 1 tablet (20 mEq total) by mouth daily.   torsemide 20 MG tablet Commonly known as: DEMADEX Take 1 tablet (20 mg total) by mouth 2 (two) times daily.           Outstanding Labs/Studies   BMET on Tuesday  Duration of Discharge Encounter   Greater than 30 minutes including physician time.  Hilbert Corrigan, PA 08/25/2021, 10:40 AM

## 2021-08-25 NOTE — Progress Notes (Addendum)
Progress Note  Patient Name: Zachary Fletcher Date of Encounter: 08/25/2021  [facility] Cardiologist: None   Subjective   No CP or SOB today  Inpatient Medications    Scheduled Meds:  dapagliflozin propanediol  10 mg Oral Daily   metoprolol succinate  25 mg Oral Daily   sodium chloride flush  3 mL Intravenous Q12H   torsemide  40 mg Oral BID   Continuous Infusions:  sodium chloride     PRN Meds: sodium chloride, acetaminophen, benzonatate, guaiFENesin-dextromethorphan, magnesium oxide, ondansetron (ZOFRAN) IV, sodium chloride flush   Vital Signs    Vitals:   08/24/21 1657 08/24/21 2248 08/25/21 0656 08/25/21 0819  BP: 116/81 132/78 124/77 (!) 130/99  Pulse: 72 82 70 82  Resp: '16 17 16   '$ Temp: 97.7 F (36.5 C) 97.6 F (36.4 C) 97.7 F (36.5 C)   TempSrc: Oral Oral Oral   SpO2:  95% 93%   Weight:   (!) 189.8 kg   Height:        Intake/Output Summary (Last 24 hours) at 08/25/2021 0848 Last data filed at 08/25/2021 0655 Gross per 24 hour  Intake 1080 ml  Output 2150 ml  Net -1070 ml       08/25/2021    6:56 AM 08/24/2021    2:13 AM 08/23/2021    5:56 AM  Last 3 Weights  Weight (lbs) 418 lb 8 oz 421 lb 1.6 oz 422 lb 12.8 oz  Weight (kg) 189.83 kg 191.01 kg 191.781 kg      Telemetry    NSR with ventricular couplet- Personally Reviewed  ECG    No new tracings  - Personally Reviewed  Physical Exam   GEN: Well nourished, well developed in no acute distress.  Morbidly obese HEENT: Normal NECK: No JVD; No carotid bruits LYMPHATICS: No lymphadenopathy CARDIAC:RRR, no murmurs, rubs, gallops RESPIRATORY:  Clear to auscultation without rales, wheezing or rhonchi  ABDOMEN: Soft, non-tender, non-distended MUSCULOSKELETAL:  No edema; No deformity  SKIN: Warm and dry NEUROLOGIC:  Alert and oriented x 3 PSYCHIATRIC:  Normal affect   Labs    High Sensitivity Troponin:   Recent Labs  Lab 08/20/21 1403 08/20/21 1545  TROPONINIHS 69* 78*       Chemistry Recent Labs  Lab 08/20/21 1403 08/21/21 0040 08/23/21 0154 08/24/21 0112 08/25/21 0122  NA 140   < > 137 137 135  K 4.1   < > 3.8 3.9 3.9  CL 106   < > 96* 97* 95*  CO2 23   < > '27 27 27  '$ GLUCOSE 145*   < > 97 86 91  BUN 15   < > 20 22* 22*  CREATININE 1.21   < > 1.29* 1.29* 1.37*  CALCIUM 9.4   < > 9.6 9.5 9.0  MG  --    < > 2.4 2.5* 2.5*  PROT 6.7  --   --   --   --   ALBUMIN 3.8  --   --   --   --   AST 16  --   --   --   --   ALT 17  --   --   --   --   ALKPHOS 66  --   --   --   --   BILITOT 1.0  --   --   --   --   GFRNONAA >60   < > >60 >60 >60  ANIONGAP 11   < > 14  13 13   < > = values in this interval not displayed.     Lipids  Recent Labs  Lab 08/22/21 0155  CHOL 138  TRIG 94  HDL 34*  LDLCALC 85  CHOLHDL 4.1     Hematology Recent Labs  Lab 08/21/21 0040 08/22/21 0155 08/23/21 0154  WBC 10.3 10.1 11.1*  RBC 5.28 5.57 6.07*  HGB 16.0 16.7 18.1*  HCT 45.9 48.7 52.5*  MCV 86.9 87.4 86.5  MCH 30.3 30.0 29.8  MCHC 34.9 34.3 34.5  RDW 13.2 13.2 13.1  PLT 180 185 211    Thyroid No results for input(s): "TSH", "FREET4" in the last 168 hours.  BNP Recent Labs  Lab 08/20/21 1401  BNP 489.8*     DDimer  Recent Labs  Lab 08/20/21 1401  DDIMER 0.76*      Radiology    No results found.  Cardiac Studies   Echocardiogram 08/21/21  1. Extremely poor acoustic windows Definity used.   2. Left ventricular ejection fraction, by estimation, is 60 to 65%. The  left ventricle has normal function.   3. Right ventricular systolic function is normal. The right ventricular  size is normal.   4. MV is not well seen. . The mitral valve is grossly normal.   5. The aortic valve is normal in structure. Aortic valve regurgitation is  not visualized.   6. The inferior vena cava is normal in size with greater than 50%  respiratory variability, suggesting right atrial pressure of 3 mmHg.   Comparison(s): The left ventricular function is  unchanged.   Patient Profile     31 y.o. male  with a past medical history of morbid obesity, h/o SVT on metoprolol, OSA who was seen on 6/13 for evaluation of SOB.   Assessment & Plan    Acute decompensated CHF  HFpEF - Most recent echocardiogram completed in 12/2019 and showed EF 60-65%, no regional wall motion abnormalities  - BNP elevated to 489.8  - CXR showed stable mildly enlarged cardiac silhouette, no acute lung process - Echocardiogram this admission was an poor quality study- showed EF 60-65%, RV and LV function grossly preserved, no significant TR, suspect RVSP in normal range  - Patient has been on IV lasix 60 mg BID transitioned to torsemide 40 mg twice daily yesterday - He put out 2.1 L yesterday and is net -20 L since admission - Believes his dry weight is around 420 lbs. Was 453 on admission, now down to 418lbs - On K supplementation while receiving IV lasix and potassium 3.9 yesterday - Creatinine remained stable at 1.29 over the past 2 days and bumped to 1.37 today - He is now below his dry weight - Continue Toprol-XL 25 mg daily and Farxiga 10 mg daily - will decrease torsemide to 20 mg twice daily - May benefit from outpatient right heart cath   NSVT  - No further NSVT on telemetry - Continue Toprol-XL 25 mg daily - Maintain K>4, mag >2   Elevated Troponin  - hsTn mildly elevated at 69>>78  - Mild elevation with flat trend is not consistent with ACS. Patient denies any chest pain   - Suspect demand given decompensated HF    OSA on BiPap  - On Bipap at home - Encouraged compliance, patient has been using at home and while admitted  - Suspect OSA is contributing to HF symptoms   History of SVT  - No SVT on telemetry  - Continue Toprol-XL 25 mg  daily - Continue to monitor patient on telemetry  - Maintain K>4, mag>2  - on K supplementation during diuresis   Morbid Obesity -I had a long discussion with him today regarding his obesity and the long-term  health effects -He is started a carnivore diet which I told him he really needs to be careful with because it is a lot of red meat this has adverse effects on his cholesterol -I encouraged him to think about weight loss surgery -He is interesting in talking more about nutrition and advanced heart failure clinic with the dietitian   I have spent a total of 35 minutes with patient reviewing 2D echo , telemetry, EKGs, labs and examining patient as well as establishing an assessment and plan that was discussed with the patient including diet, healthy weight and discussion of weight loss surgery.  > 50% of time was spent in direct patient care.     He is stable for discharge home today.  Will discharge home with Farxiga 10 mg daily, Toprol-XL 25 mg daily and Demadex 20 mg twice daily.  He will need to bmet on Tuesday.  We will get him back into transitional care clinic in heart failure clinic next week  For questions or updates, please contact [facility] Please consult [email or web address] for contact info under        Signed, Fransico Him, MD  08/25/2021, 8:48 AM

## 2021-08-26 ENCOUNTER — Telehealth (HOSPITAL_COMMUNITY): Payer: Self-pay

## 2021-08-26 ENCOUNTER — Telehealth: Payer: Self-pay

## 2021-08-26 NOTE — Telephone Encounter (Signed)
Transition Care Management Unsuccessful Follow-up Telephone Call  Date of discharge and from where:  08/25/2021  [name]   Attempts:  1st Attempt  Reason for unsuccessful TCM follow-up call:  No answer/busy

## 2021-08-26 NOTE — Telephone Encounter (Signed)
Pt is not interested in the cardiac rehab program. Closed referral 

## 2021-08-27 ENCOUNTER — Other Ambulatory Visit: Payer: Self-pay

## 2021-08-27 DIAGNOSIS — N179 Acute kidney failure, unspecified: Secondary | ICD-10-CM | POA: Diagnosis not present

## 2021-08-28 LAB — BASIC METABOLIC PANEL
BUN/Creatinine Ratio: 18 (ref 9–20)
BUN: 22 mg/dL — ABNORMAL HIGH (ref 6–20)
CO2: 26 mmol/L (ref 20–29)
Calcium: 9.4 mg/dL (ref 8.7–10.2)
Chloride: 94 mmol/L — ABNORMAL LOW (ref 96–106)
Creatinine, Ser: 1.19 mg/dL (ref 0.76–1.27)
Glucose: 90 mg/dL (ref 70–99)
Potassium: 4.5 mmol/L (ref 3.5–5.2)
Sodium: 140 mmol/L (ref 134–144)
eGFR: 84 mL/min/{1.73_m2} (ref >59)

## 2021-08-29 ENCOUNTER — Ambulatory Visit: Payer: BC Managed Care – PPO | Admitting: Student

## 2021-08-29 NOTE — Progress Notes (Signed)
Stable renal function and electrolyte. Kidney function improved when compare to 4 days ago.

## 2021-08-30 ENCOUNTER — Ambulatory Visit: Payer: BC Managed Care – PPO | Admitting: Nurse Practitioner

## 2021-08-30 ENCOUNTER — Encounter: Payer: Self-pay | Admitting: Nurse Practitioner

## 2021-08-30 VITALS — BP 124/68 | HR 86 | Ht 75.0 in | Wt >= 6400 oz

## 2021-08-30 DIAGNOSIS — I471 Supraventricular tachycardia, unspecified: Secondary | ICD-10-CM

## 2021-08-30 DIAGNOSIS — G4733 Obstructive sleep apnea (adult) (pediatric): Secondary | ICD-10-CM

## 2021-08-30 DIAGNOSIS — I5032 Chronic diastolic (congestive) heart failure: Secondary | ICD-10-CM

## 2021-08-30 DIAGNOSIS — R002 Palpitations: Secondary | ICD-10-CM

## 2021-08-30 DIAGNOSIS — I4729 Other ventricular tachycardia: Secondary | ICD-10-CM | POA: Diagnosis not present

## 2021-09-03 DIAGNOSIS — M9903 Segmental and somatic dysfunction of lumbar region: Secondary | ICD-10-CM | POA: Diagnosis not present

## 2021-09-03 DIAGNOSIS — M5414 Radiculopathy, thoracic region: Secondary | ICD-10-CM | POA: Diagnosis not present

## 2021-09-03 DIAGNOSIS — M9902 Segmental and somatic dysfunction of thoracic region: Secondary | ICD-10-CM | POA: Diagnosis not present

## 2021-09-03 DIAGNOSIS — M5386 Other specified dorsopathies, lumbar region: Secondary | ICD-10-CM | POA: Diagnosis not present

## 2021-09-05 NOTE — Progress Notes (Signed)
HEART & VASCULAR TRANSITION OF CARE CONSULT NOTE     Referring Physician: Fransico Him, MD Primary Care: [name], NP Primary Cardiologist: Establishing with Dr. Stanford Breed  HPI: Referred to clinic by Dr. Radford Pax with [facility] for heart failure consultation. 31 y.o. male with history of morbid obesity, hx SVT, OSA on BiPAP.   He was admitted earlier this month with acute diastolic CHF. BNP 488. Echo poor quality: EF 60-65%, could not evaluation diastolic function or RV function. Diuresed with IV lasix. Discharged on po torsemide. Diuresed nearly 30 lb. RHC had been considered but he declined.  Saw cardiology for f/u 06/23. Appeared euvolemic. Continued current doses of Farxiga and Torsemide. Declined RHC. He was referred for sleep medicine follow-up.  Here today for hospital follow-up.  Has been doing well. Reports dyspnea has significantly improved since discharge. No orthopnea, PND or lower extremity edema. Has noticed presence of varicose veins on b/l LE. He has stopped eating out for breakfast and lunch. He is limiting sodium intake to less than 2g/day, had not been monitoring prior to admit. Struggles with fluid restriction at times especially when working outside on a hot day. Notes very occasional dizziness that is brief in duration. No presyncope or syncope.  He is highly motivated to work on weight loss. States he has gained a significant amount of weight the last couple of years.  Review of Systems: [y] = yes, '[ ]'$  = no   General: Weight gain [Y]; Weight loss '[ ]'$ ; Anorexia '[ ]'$ ; Fatigue '[ ]'$ ; Fever '[ ]'$ ; Chills '[ ]'$ ; Weakness '[ ]'$   Cardiac: Chest pain/pressure '[ ]'$ ; Resting SOB '[ ]'$ ; Exertional SOB '[ ]'$ ; Orthopnea '[ ]'$ ; Pedal Edema '[ ]'$ ; Palpitations '[ ]'$ ; Syncope '[ ]'$ ; Presyncope '[ ]'$ ; Paroxysmal nocturnal dyspnea'[ ]'$   Pulmonary: Cough '[ ]'$ ; Wheezing'[ ]'$ ; Hemoptysis'[ ]'$ ; Sputum '[ ]'$ ; Snoring '[ ]'$   GI: Vomiting'[ ]'$ ; Dysphagia'[ ]'$ ; Melena'[ ]'$ ; Hematochezia '[ ]'$ ; Heartburn'[ ]'$ ; Abdominal pain  '[ ]'$ ; Constipation '[ ]'$ ; Diarrhea '[ ]'$ ; BRBPR '[ ]'$   GU: Hematuria'[ ]'$ ; Dysuria '[ ]'$ ; Nocturia'[ ]'$   Vascular: Pain in legs with walking '[ ]'$ ; Pain in feet with lying flat '[ ]'$ ; Non-healing sores '[ ]'$ ; Stroke '[ ]'$ ; TIA '[ ]'$ ; Slurred speech '[ ]'$ ;  Neuro: Headaches'[ ]'$ ; Vertigo'[ ]'$ ; Seizures'[ ]'$ ; Paresthesias'[ ]'$ ;Blurred vision '[ ]'$ ; Diplopia '[ ]'$ ; Vision changes '[ ]'$   Ortho/Skin: Arthritis '[ ]'$ ; Joint pain '[ ]'$ ; Muscle pain '[ ]'$ ; Joint swelling '[ ]'$ ; Back Pain '[ ]'$ ; Rash '[ ]'$   Psych: Depression'[ ]'$ ; Anxiety'[ ]'$   Heme: Bleeding problems '[ ]'$ ; Clotting disorders '[ ]'$ ; Anemia '[ ]'$   Endocrine: Diabetes '[ ]'$ ; Thyroid dysfunction'[ ]'$    Past Medical History:  Diagnosis Date   Sleep apnea, obstructive    Tachycardia     Current Outpatient Medications  Medication Sig Dispense Refill   acetaminophen (TYLENOL) 500 MG tablet Take 500 mg by mouth every 6 (six) hours as needed.     dapagliflozin propanediol (FARXIGA) 10 MG TABS tablet Take 1 tablet (10 mg total) by mouth daily. 90 tablet 3   ibuprofen (ADVIL) 200 MG tablet Take 200 mg by mouth every 6 (six) hours as needed.     metoprolol succinate (TOPROL-XL) 25 MG 24 hr tablet TAKE 1 TABLET BY MOUTH EVERY DAY 90 tablet 3   potassium chloride SA (KLOR-CON M) 20 MEQ tablet Take 1 tablet (20 mEq total) by mouth daily. 30 tablet 3   torsemide (DEMADEX) 20 MG tablet  Take 1 tablet (20 mg total) by mouth 2 (two) times daily. 60 tablet 3   No current facility-administered medications for this encounter.    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  Tobacco Use   Smoking status: Never   Smokeless tobacco: Never  Vaping Use   Vaping Use: Never used  Substance and Sexual Activity    Alcohol use: Yes    Comment: Rare   Drug use: Never   Sexual activity: Not on file  Other Topics Concern   Not on file  Social History Narrative   Right handed    Live with spouse    Social Determinants of Health   Financial Resource Strain: Low Risk  (08/22/2021)   Overall Financial Resource Strain (CARDIA)    Difficulty of Paying Living Expenses: Not hard at all  Food Insecurity: No Food Insecurity (08/22/2021)   Hunger Vital Sign    Worried About Running Out of Food in the Last Year: Never true    [place] in the Last Year: Never true  Transportation Needs: No Transportation Needs (08/22/2021)   PRAPARE - Hydrologist (Medical): No    Lack of Transportation (Non-Medical): No  Physical Activity: Not on file  Stress: Not on file  Social Connections: Not on file  Intimate Partner Violence: Not on file      Family History  Problem Relation Age of Onset   Sleep apnea Father    Diabetes Other     Vitals:   09/06/21 1122  BP: 118/70  Pulse: 66  SpO2: 96%  Weight: (!) 193 kg (425 lb 8 oz)    PHYSICAL EXAM: General:  Well appearing. Ambulated into clinic. HEENT: normal Neck: supple. JVP difficult to assess d/t neck size. Carotids 2+ bilat; no bruits.  Cor: PMI nondisplaced. Regular rate & rhythm. No rubs, gallops or murmurs. Lungs: clear Abdomen: obese, soft, nontender, nondistended.  Extremities: no cyanosis, clubbing, rash, chronic venous stasis changes noted to b/l LE, + varicose veins, 1+ edema Neuro: alert & oriented x 3, cranial nerves grossly intact. moves all 4 extremities w/o difficulty. Affect pleasant.   ASSESSMENT & PLAN: Chronic diastolic CHF: -Echo 96/28: Poor quality study, EF 60-65%, could not evaluation diastolic function or RV function -Suspect HFpEF d/t combination of obesity, OSA and sodium/fluid intake.  -NYHA II. Volume looks good on exam today. Suspect chronic venous insufficiency contributing to LE edema.  Prescribed compression stockings. -Continue Torsemide 20 BID -Continue Farxiga 10 mg daily -Continue metoprolol XL 25 daily for SVT -Discussed adding ARB/ARNI but he would prefer to hold off on any additional medication changes at this time -Reiterated importance of fluid and sodium restriction, congratulated him on lifestyle changes so far -Weight loss imperative -Labs today  2. OSA: -On BiPAP -Has been referred to Dr. Claiborne Billings to see if needs titration  3. Morbid obesity: -Body mass index is 53.18 kg/m.  -Offered referral to weight management. He wants to try to work on weight loss on his own initially.  4. Hx SVT: -Noted on prior cardiac monitor. -Continue metoprolol xl.   NYHA II GDMT  Diuretic-Torsemide 20 BID BB-Metoprolol  XL 25 daily for SVT Ace/ARB/ARNI-Declined MRA-Declined SGLT2i-Farxiga 10 daily    Referred to HFSW (PCP, Medications, Transportation, ETOH Abuse, Drug Abuse, Insurance, Financial ): No Refer to Pharmacy: No Refer to Home Health: No Refer to Advanced Heart Failure Clinic: No Refer to General Cardiology: No, already established with [facility]  Follow up  As needed. F/u with Dr. Stanford Breed as scheduled in August 2023

## 2021-09-06 ENCOUNTER — Ambulatory Visit (HOSPITAL_COMMUNITY)
Admit: 2021-09-06 | Discharge: 2021-09-06 | Disposition: A | Discharge status: Home / Self Care | Payer: BC Managed Care – PPO | Attending: Physician Assistant | Admitting: Physician Assistant

## 2021-09-06 ENCOUNTER — Telehealth (HOSPITAL_COMMUNITY): Payer: Self-pay | Admitting: *Deleted

## 2021-09-06 ENCOUNTER — Encounter (HOSPITAL_COMMUNITY): Payer: Self-pay

## 2021-09-06 VITALS — BP 118/70 | HR 66 | Wt >= 6400 oz

## 2021-09-06 DIAGNOSIS — I5032 Chronic diastolic (congestive) heart failure: Secondary | ICD-10-CM | POA: Diagnosis not present

## 2021-09-06 DIAGNOSIS — Z7984 Long term (current) use of oral hypoglycemic drugs: Secondary | ICD-10-CM | POA: Diagnosis not present

## 2021-09-06 DIAGNOSIS — I471 Supraventricular tachycardia: Secondary | ICD-10-CM | POA: Insufficient documentation

## 2021-09-06 DIAGNOSIS — Z6841 Body Mass Index (BMI) 40.0 and over, adult: Secondary | ICD-10-CM | POA: Insufficient documentation

## 2021-09-06 DIAGNOSIS — G4733 Obstructive sleep apnea (adult) (pediatric): Secondary | ICD-10-CM | POA: Diagnosis not present

## 2021-09-06 DIAGNOSIS — Z79899 Other long term (current) drug therapy: Secondary | ICD-10-CM | POA: Insufficient documentation

## 2021-09-06 LAB — BASIC METABOLIC PANEL
Anion gap: 9 (ref 5–15)
BUN: 21 mg/dL — ABNORMAL HIGH (ref 6–20)
CO2: 28 mmol/L (ref 22–32)
Calcium: 9.4 mg/dL (ref 8.9–10.3)
Chloride: 102 mmol/L (ref 98–111)
Creatinine, Ser: 1.23 mg/dL (ref 0.61–1.24)
GFR, Estimated: >60 mL/min (ref >60)
Glucose, Bld: 95 mg/dL (ref 70–99)
Potassium: 4 mmol/L (ref 3.5–5.1)
Sodium: 139 mmol/L (ref 135–145)

## 2021-09-06 LAB — BRAIN NATRIURETIC PEPTIDE: B Natriuretic Peptide: 220.8 pg/mL — ABNORMAL HIGH (ref 0.0–100.0)

## 2021-09-06 NOTE — Telephone Encounter (Signed)
Called to confirm Heart & Vascular Transitions of Care appointment at 11 am on 09/06/21. Patient reminded to bring all medications and pill box organizer with them. Confirmed patient has transportation. Gave directions, instructed to utilize [facility] parking.  Confirmed appointment prior to ending call.    Earnestine Leys, BSN, Clinical cytogeneticist Only

## 2021-09-06 NOTE — Patient Instructions (Addendum)
Labs done today. We will contact you only if your labs are abnormal.  No medication changes were made. Please continue all current medications as prescribed.  Please wear your compression hose daily, place them on as soon as you get up in the morning and remove before you go to bed at night. You can purchase these at [facility] medical supply. [address], [place], [place] [address] [phone]  Thank you for allowing [place] to provide your heart failure care after your recent hospitalization. Please keep your follow-up with [facility] at [facility] in August.   If you have any questions, issues, or concerns before your next appointment please call our office at [phone], opt. 2 and leave a message for the triage nurse.

## 2021-09-24 DIAGNOSIS — M5414 Radiculopathy, thoracic region: Secondary | ICD-10-CM | POA: Diagnosis not present

## 2021-09-24 DIAGNOSIS — M5386 Other specified dorsopathies, lumbar region: Secondary | ICD-10-CM | POA: Diagnosis not present

## 2021-09-24 DIAGNOSIS — M9903 Segmental and somatic dysfunction of lumbar region: Secondary | ICD-10-CM | POA: Diagnosis not present

## 2021-09-24 DIAGNOSIS — M9902 Segmental and somatic dysfunction of thoracic region: Secondary | ICD-10-CM | POA: Diagnosis not present

## 2021-10-03 DIAGNOSIS — M9903 Segmental and somatic dysfunction of lumbar region: Secondary | ICD-10-CM | POA: Diagnosis not present

## 2021-10-03 DIAGNOSIS — M5386 Other specified dorsopathies, lumbar region: Secondary | ICD-10-CM | POA: Diagnosis not present

## 2021-10-03 DIAGNOSIS — M5414 Radiculopathy, thoracic region: Secondary | ICD-10-CM | POA: Diagnosis not present

## 2021-10-03 DIAGNOSIS — M9902 Segmental and somatic dysfunction of thoracic region: Secondary | ICD-10-CM | POA: Diagnosis not present

## 2021-10-08 DIAGNOSIS — M5386 Other specified dorsopathies, lumbar region: Secondary | ICD-10-CM | POA: Diagnosis not present

## 2021-10-08 DIAGNOSIS — M9903 Segmental and somatic dysfunction of lumbar region: Secondary | ICD-10-CM | POA: Diagnosis not present

## 2021-10-08 DIAGNOSIS — M5414 Radiculopathy, thoracic region: Secondary | ICD-10-CM | POA: Diagnosis not present

## 2021-10-08 DIAGNOSIS — M9902 Segmental and somatic dysfunction of thoracic region: Secondary | ICD-10-CM | POA: Diagnosis not present

## 2021-10-10 ENCOUNTER — Encounter: Payer: Self-pay | Admitting: Cardiovascular Disease

## 2021-10-10 ENCOUNTER — Ambulatory Visit: Payer: BC Managed Care – PPO | Admitting: Cardiovascular Disease

## 2021-10-10 DIAGNOSIS — G4733 Obstructive sleep apnea (adult) (pediatric): Secondary | ICD-10-CM | POA: Diagnosis not present

## 2021-10-10 DIAGNOSIS — R002 Palpitations: Secondary | ICD-10-CM | POA: Diagnosis not present

## 2021-10-10 DIAGNOSIS — I498 Other specified cardiac arrhythmias: Secondary | ICD-10-CM

## 2021-10-10 DIAGNOSIS — I4729 Other ventricular tachycardia: Secondary | ICD-10-CM | POA: Diagnosis not present

## 2021-10-10 DIAGNOSIS — I471 Supraventricular tachycardia: Secondary | ICD-10-CM

## 2021-10-10 DIAGNOSIS — I5032 Chronic diastolic (congestive) heart failure: Secondary | ICD-10-CM | POA: Diagnosis not present

## 2021-10-10 MED ORDER — METOPROLOL SUCCINATE ER 25 MG PO TB24: ORAL_TABLET | ORAL | 3 refills | Status: DC

## 2021-10-10 NOTE — Progress Notes (Signed)
Cardiology Office Note    Date:  10/15/2021   ID:  Zachary Fletcher, DOB 07/29/1990, MRN [id number]  PCP:  Flossie Buffy, NP  Cardiologist:  Shelva Majestic, MD (sleep); Dr. Stanford Breed  New Sleep Consultation referred through Diona Browner, NP   History of Present Illness:  Zachary Fletcher is a 31 y.o. male who is followed by Dr. Kirk Ruths for cardiology care.  He has a history of SVT, acute on chronic diastolic heart failure, obstructive sleep apnea, and morbid obesity.  In September 2021 he presented to the emergency room and had complaints of palpitations and cough.  Chest x-ray revealed mild cardiac enlargement.  An echo Doppler study showed normal LV function.  He had episodes of SVT and 4 beats of nonsustained VT on a outpatient monitor for which he was treated with metoprolol.  He had recently been hospitalized on October 20, 2021 with increasing shortness of breath, orthopnea PND and lower extremity edema.  He was evaluated by cardiology.  His CT of his chest was negative for PE.  An echo showed normal systolic function.  And he was diuresed with IV Lasix and transitioned to torsemide.  Following his recent hospitalization, the patient was evaluated by Diona Browner, NP.  At that time he felt improved with reference to his recent acute heart failure exacerbation.  During that evaluation, it was noted that the patient remotely had been followed at [facility] and had undergone a sleep study but never saw a sleep physician in follow-up.  CPAP was instituted and he believes he may need pressure adjustments.  He now presents to the office today for sleep consultation with me referred by Diona Browner, NP for further evaluation and treatment.  Apparently, the patient had undergone an initial sleep evaluation by Dr. Corliss Skains and ENT physician at [facility] ENT and allergy in [facility] on July 06, 2019.  He had symptoms of loud snoring, excessive daytime sleepiness,  awakening gasping for breath, nocturia, nocturnal palpitations, nonrestorative sleep and fatigue.  It was recommended that he proceed with a sleep study which was done on September 14, 2019.  We were able to obtain a copy of this evaluation which demonstrated severe sleep apnea with an AHI of 70.8/h.  He had significant oxygen desaturation to a nadir of 77%.  He met split-night criteria and was unable to tolerate CPAP but was switched to BiPAP and it was recommended that a pressure of 15 over 10 cm of water was optimal with O2 saturation improving to 93%..  Mr. Zachary Fletcher states that he never saw a sleep physician following his split-night study.  He received a ResMed air curve 10 via auto BiPAP unit but initially did not know what to do with this machine since he has never seen a sleep physician.  He did some research on BiPAP and experimented with pressures and ultimately set his own pressures to what he thought felt the best.  He has not seen a sleep physician since his BiPAP initiation and is now referred by Diona Browner, NP for this sleep consultation and follow-up sleep care.  Apparently, we were able to obtain information from his machine and download from January 2 through April 09, 2021 shows 100% compliance with average use at 7 hours and 28 minutes.  His pressure range was set at an IPAP of 21 and EPAP minimum of 9 with a pressure support of 9.  AHI was 0.7.  A subsequent download from May 5 through  October 09, 2021 again again demonstrates excellent compliance.  He admits to 1 and percent use but there were 4 days where his Internet was out and apparently the data was not recorded.  On his most recent download his minimum EPAP is 9 with a pressure support of 10 cm and maximum IPAP pressure of 25.  His 95th percentile pressure is 21.9/12.6 and his maximum average pressure was 22.3/13.1.  He does have a history of snoring which has improved.  His sleep is more restorative.  Previous nocturia has improved.  An  Epworth Sleepiness Scale score was calculated in the office today and this endorsed at 5 as shown below, arguing against excessive daytime sleepiness:  Epworth Sleepiness Scale: Situation   Chance of Dozing/Sleeping (0 = never , 1 = slight chance , 2 = moderate chance , 3 = high chance )   sitting and reading 1   watching TV 1   sitting inactive in a public place 0   being a passenger in a motor vehicle for an hour or more 2   lying down in the afternoon 1   sitting and talking to someone 0   sitting quietly after lunch (no alcohol) 0   while stopped for a few minutes in traffic as the driver 0   Total Score  5    He denies any bruxism, restless legs, hypnopompic or hypnagogic hallucinations or cataplectic events.  He admits that at times he experiences some recurrent tachycardic episodes despite taking metoprolol succinate 25 mg daily and he continues to be on torsemide 20 mg twice a day and Farxiga 10 mg daily.  It was my last dictation he presents for his initial sleep evaluation.   Past Medical History:  Diagnosis Date   Sleep apnea, obstructive    Tachycardia     Past Surgical History:  Procedure Laterality Date   No prior surgery      Current Medications: Outpatient Medications Prior to Visit  Medication Sig Dispense Refill   dapagliflozin propanediol (FARXIGA) 10 MG TABS tablet Take 1 tablet (10 mg total) by mouth daily. 90 tablet 3   potassium chloride SA (KLOR-CON M) 20 MEQ tablet Take 1 tablet (20 mEq total) by mouth daily. 30 tablet 3   torsemide (DEMADEX) 20 MG tablet Take 1 tablet (20 mg total) by mouth 2 (two) times daily. 60 tablet 3   metoprolol succinate (TOPROL-XL) 25 MG 24 hr tablet TAKE 1 TABLET BY MOUTH EVERY DAY 90 tablet 3   acetaminophen (TYLENOL) 500 MG tablet Take 500 mg by mouth every 6 (six) hours as needed.     ibuprofen (ADVIL) 200 MG tablet Take 200 mg by mouth every 6 (six) hours as needed.     No facility-administered medications prior to visit.      Allergies:   Patient has no known allergies.   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  Tobacco Use   Smoking status: Never   Smokeless tobacco: Never  Vaping Use   Vaping Use: Never used  Substance and Sexual Activity   Alcohol use: Yes    Comment: Rare   Drug use: Never   Sexual activity: Not on file  Other  Topics Concern   Not on file  Social History Narrative   Right handed    Live with spouse    Social Determinants of Health   Financial Resource Strain: Low Risk  (08/22/2021)   Overall Financial Resource Strain (CARDIA)    Difficulty of Paying Living Expenses: Not hard at all  Food Insecurity: No Food Insecurity (08/22/2021)   Hunger Vital Sign    Worried About Running Out of Food in the Last Year: Never true    Ran Out of Food in the Last Year: Never true  Transportation Needs: No Transportation Needs (08/22/2021)   PRAPARE - Hydrologist (Medical): No    Lack of Transportation (Non-Medical): No  Physical Activity: Not on file  Stress: Not on file  Social Connections: Not on file    Socially he was born in [place].  He is married for 3 years.  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  There is no tobacco history.  He drinks approximately 2 drinks per week.  He does not routinely exercise but plans to initiate some form of exercise.  Family History:  The patient's family history includes Diabetes in an other family member; Sleep apnea in his father.  Both parents are living.  Mother is 65 and has hypertension, father is 82 and has hypertension.  He has 2 brothers ages 5 and 55 and the 13 year old brother has sleep  apnea.  ROS General: Negative; No fevers, chills, or night sweats;  HEENT: Negative; No changes in vision or hearing, sinus congestion, difficulty swallowing Pulmonary: Negative; No cough, wheezing, shortness of breath, hemoptysis Cardiovascular: See HPI GI: Negative; No nausea, vomiting, diarrhea, or abdominal pain GU: Negative; No dysuria, hematuria, or difficulty voiding Musculoskeletal: Negative; no myalgias, joint pain, or weakness Hematologic/Oncology: Negative; no easy bruising, bleeding Endocrine: Negative; no heat/cold intolerance; no diabetes Neuro: Negative; no changes in balance, headaches Skin: Negative; No rashes or skin lesions Psychiatric: Negative; No behavioral problems, depression Sleep: See HPI Other comprehensive 14 point system review is negative.   PHYSICAL EXAM:   VS:  BP 128/78   Pulse 67   Ht 6' 3" (1.905 m)   Wt (!) 426 lb 6.4 oz (193.4 kg)   SpO2 97%   BMI 53.30 kg/m     Repeat blood pressure by me was 126/78  Wt Readings from Last 3 Encounters:  10/10/21 (!) 426 lb 6.4 oz (193.4 kg)  09/06/21 (!) 425 lb 8 oz (193 kg)  08/30/21 (!) 426 lb (193.2 kg)    General: Alert, oriented, no distress.  Super morbid obesity with BMI 53.3. Skin: normal turgor, no rashes, warm and dry HEENT: Normocephalic, atraumatic. Pupils equal round and reactive to light; sclera anicteric; extraocular muscles intact;  Nose without nasal septal hypertrophy Mouth/Parynx benign; Mallinpatti scale 3/4 Neck: No JVD, no carotid bruits; normal carotid upstroke Lungs: clear to ausculatation and percussion; no wheezing or rales Chest wall: without tenderness to palpitation Heart: PMI not displaced, trigeminal rhythm, s1 s2 normal, 1/6 systolic murmur, no diastolic murmur, no rubs, gallops, thrills, or heaves Abdomen: soft, nontender; no hepatosplenomehaly, BS+; abdominal aorta nontender and not dilated by palpation. Back: no CVA tenderness Pulses 2+ Musculoskeletal: full range  of motion, normal strength, no joint deformities Extremities: no clubbing cyanosis or edema, Homan's sign negative  Neurologic: grossly nonfocal; Cranial nerves grossly wnl Psychologic: Normal mood and affect   Studies/Labs Reviewed:   October 10, 2021 ECG (  independently read by me): Sinus rhythm with PVCs in trigeminal rhythm  Recent Labs:    Latest Ref Rng & Units 09/06/2021   12:02 PM 08/27/2021    2:15 PM 08/25/2021    1:22 AM  BMP  Glucose 70 - 99 mg/dL 95  90  91   BUN 6 - 20 mg/dL _0 Creatinine 0.61 - 1.24 mg/dL 1.23  1.19  1.37   BUN/Creat Ratio 9 - 20  18    Sodium 135 - 145 mmol/L 139  140  135   Potassium 3.5 - 5.1 mmol/L 4.0  4.5  3.9   Chloride 98 - 111 mmol/L 102  94  95   CO2 22 - 32 mmol/L _1 Calcium 8.9 - 10.3 mg/dL 9.4  9.4  9.0         Latest Ref Rng & Units 08/20/2021    2:03 PM 11/19/2020   10:23 AM 11/29/2019   10:46 AM  Hepatic Function  Total Protein 6.5 - 8.1 g/dL 6.7  6.5  6.7   Albumin 3.5 - 5.0 g/dL 3.8  3.7    AST 15 - 41 U/L _2 ALT 0 - 44 U/L _3 Alk Phosphatase 38 - 126 U/L 66  71    Total Bilirubin 0.3 - 1.2 mg/dL 1.0  0.4  0.7        Latest Ref Rng & Units 08/23/2021    1:54 AM 08/22/2021    1:55 AM 08/21/2021   12:40 AM  CBC  WBC 4.0 - 10.5 K/uL 11.1  10.1  10.3   Hemoglobin 13.0 - 17.0 g/dL 18.1  16.7  16.0   Hematocrit 39.0 - 52.0 % 52.5  48.7  45.9   Platelets 150 - 400 K/uL 211  185  180    Lab Results  Component Value Date   MCV 86.5 08/23/2021   MCV 87.4 08/22/2021   MCV 86.9 08/21/2021   Lab Results  Component Value Date   TSH 1.95 11/19/2020   Lab Results  Component Value Date   HGBA1C 5.9 (H) 08/22/2021     BNP    Component Value Date/Time   BNP 220.8 (H) 09/06/2021 1202    ProBNP No results found for: "PROBNP"   Lipid Panel     Component Value Date/Time   CHOL 138 08/22/2021 0155   TRIG 94 08/22/2021 0155   HDL 34 (L) 08/22/2021 0155   CHOLHDL 4.1 08/22/2021  0155   VLDL 19 08/22/2021 0155   LDLCALC 85 08/22/2021 0155   LDLDIRECT 82.0 11/19/2020 1023     RADIOLOGY: No results found.   Additional studies/ records that were reviewed today include:   I reviewed records of [facility] including the initial office visit of Dr. Corliss Skains on July 06, 2019 and the subsequent polysomnogram/CPAP/BiPAP titration from September 14, 2019 done at the [facility] sleep lab in [facility].   ASSESSMENT:    1. OSA (obstructive sleep apnea)   2. Palpitations   3. Ventricular trigeminy   4. Chronic diastolic heart failure ([place])   5. SVT (supraventricular tachycardia) ([place])   6. NSVT (nonsustained ventricular tachycardia) ([facility])   7. Morbid obesity ([place])      PLAN:  Mr. Zachary Fletcher is a 31 year old gentleman who has a history of morbid obesity, as well as prior evaluations with acute on chronic diastolic  heart failure, SVT, and apparently underwent an initial sleep evaluation at [facility] in [place] in 2021.  Apparently, the patient never saw physician following his sleep study and was just told that the results are in "my chart." I spent over 50 minutes with him today and was able to obtain data regarding his spit night sleep study which revealed severe sleep apnea with an AHI of 70.8/h and with supine sleep, events were more severe with an AHI of 96/h.  Apparently, he did some investigation and set his pressures himself and over the past several years.  He has been on an EPAP minimum pressure of 9 with pressure support at 9 to 10 cm, most recently 10 cm with maximal IPAP at 25 cm.  Currently he is using a ResMed F20 large fullface mask.  His downloads does not reveal any significant leak.  Prior to initiating therapy he was significantly symptomatic with awakening gasping for breath, snoring, awakening with cold sweats, nocturnal palpitations, frequent nocturia of 3-4 times per night.  His sleep was nonrestorative  and he had significant residual fatigue.  He has had cardiovascular issues with SVT and nocturnal arrhythmias and had been hospitalized in June 2023 with acute on chronic diastolic heart failure.  His echo Doppler study showed an EF of 60 to 65% without significant valvular abnormalities and he diuresed with medical therapy.  His cardiovascular symptoms have improved with reference to his dyspnea PND orthopnea and he now is on Farxiga in addition to torsemide 20 mg twice a day.  He has had issues still with recurrent tachycardia despite taking current dose of metoprolol succinate at 25 mg daily.  I spent over 50 minutes with him in the office today trying to gather data and also discussed with him at length his sleep apnea.  I discussed normal sleep architecture with typically 75% of the night and non-REM sleep and 20 to 25% of the night and REM sleep.  I discussed increased arousals generating increased sympathetic tone during periods of non-REM parasympathetic response resulting in the potential for nocturnal arrhythmias as well as preventing the normal diastolic dip with blood pressure which occurs during sleep.  In addition to its effect on blood pressure, I discussed potential increased risk for nocturnal arrhythmias, and significant increased risk for atrial fibrillation.  In addition I discussed potential increased risk for recurrent atrial fibrillation despite successful cardioversion if sleep apnea is untreated.  In addition I discussed the negative implications with reference to insulin resistance, increased inflammatory markers, and potential increased risk for nocturnal GERD.  I discussed that when GERD occurs nocturnally it is best for him to sleep on his left side.  In addition he was found to have significant nocturnal hypoxemia with O2 desaturated to 77% during his sleep evaluation.  I discussed potential nocturnal hypoxemia contributing to nocturnal ischemia both cardiac as well as fibrovascular if  left untreated.  He is on a remarkable job and setting up the BiPAP himself and currently his pressure settings are near optimal.  He is set at an EPAP minimum of 9 with current pressure support of 10 and IPAP maximum of 25.  His 95th percentile pressure is 21.9/12.6 with maximum average pressure 22.3/13.1.  He is averaging approximately 7 hours of BiPAP use per night.  He has not had any residual daytime sleepiness and prior symptomatology is essentially resolved.  However, he continues to experience some recurrent episodes of tachycardia.  His blood pressure today is stable and resting  pulse is 67.  His ECG today shows a sinus rhythm with trigeminal PVCs. I have recommended slight titration on his current dose of metoprolol succinate from 25 mg daily to 37.5 mg and if tachycardia persists further titration to 50 mg may be necessary.  His BMI is 53.3.  Weight loss and initiation of exercise was strongly recommended.  He will follow-up with Dr. Stanford Breed and Belinda Block, NP for cardiology care and I will see him in 1 year for sleep evaluation or sooner as needed.   Medication Adjustments/Labs and Tests Ordered: Current medicines are reviewed at length with the patient today.  Concerns regarding medicines are outlined above.  Medication changes, Labs and Tests ordered today are listed in the Patient Instructions below. Patient Instructions  Medication Instructions:   Changes  take Metoprolol   25 mg in the morning  12.5 mg the evening   Take torsemide in the morning   and the Second dose around 4 pm in the eveing    *If you need a refill on your cardiac medications before your next appointment, please call your pharmacy*   Lab Work:  Not needed   Testing/Procedures:  Not needed  Follow-Up: At [facility], you and your health needs are our priority.  As part of our continuing mission to provide you with exceptional heart care, we have created designated Provider Care Teams.  These Care Teams  include your primary Cardiologist (physician) and Advanced Practice Providers (APPs -  Physician Assistants and Nurse Practitioners) who all work together to provide you with the care you need, when you need it.     Your next appointment:   12 month(s)  The format for your next appointment:   In Person  Provider:   Dr Shelva Majestic    Other Instructions   pressure changes were done to your C-PAP   A new mask given - F20 lge   Signed, Shelva Majestic, MD, [facility], [place], American Board of Sleep Medicine  10/15/2021 1:34 PM    [facility] [address], [place], [place], [place]  [address] Phone: [phone]

## 2021-10-10 NOTE — Patient Instructions (Addendum)
Medication Instructions:   Changes  take Metoprolol   25 mg in the morning  12.5 mg the evening   Take torsemide in the morning   and the Second dose around 4 pm in the eveing    *If you need a refill on your cardiac medications before your next appointment, please call your pharmacy*   Lab Work:  Not needed   Testing/Procedures:  Not needed  Follow-Up: At [facility], you and your health needs are our priority.  As part of our continuing mission to provide you with exceptional heart care, we have created designated Provider Care Teams.  These Care Teams include your primary Cardiologist (physician) and Advanced Practice Providers (APPs -  Physician Assistants and Nurse Practitioners) who all work together to provide you with the care you need, when you need it.     Your next appointment:   12 month(s)  The format for your next appointment:   In Person  Provider:   Dr Shelva Majestic    Other Instructions   pressure changes were done to your C-PAP   A new mask given - F20 lge

## 2021-10-15 ENCOUNTER — Other Ambulatory Visit: Payer: Self-pay | Admitting: Physician Assistant

## 2021-10-15 ENCOUNTER — Encounter: Payer: Self-pay | Admitting: Cardiovascular Disease

## 2021-10-22 ENCOUNTER — Encounter: Payer: BC Managed Care – PPO | Admitting: Neurology

## 2021-10-22 ENCOUNTER — Encounter: Payer: Self-pay | Admitting: Neurology

## 2021-10-22 DIAGNOSIS — Z029 Encounter for administrative examinations, unspecified: Secondary | ICD-10-CM

## 2021-10-23 NOTE — Progress Notes (Signed)
HPI: FU palpitations. Echocardiogram October 2021 showed normal LV function.  Monitor November 2021 showed sinus rhythm with 32nd run of SVT and 4 beats of nonsustained ventricular tachycardia.  Toprol added.  Admitted with CHF symptoms June 2023.  Echocardiogram June 2023 showed normal LV function.  CTA June 2023 showed no pulmonary embolus.  Treated with diuretics with improvement.  Since last seen he denies dyspnea, chest pain, palpitations, syncope or pedal edema.  Current Outpatient Medications  Medication Sig Dispense Refill   dapagliflozin propanediol (FARXIGA) 10 MG TABS tablet Take 1 tablet (10 mg total) by mouth daily. 90 tablet 3   metoprolol succinate (TOPROL-XL) 25 MG 24 hr tablet Take 25 mg  in the morning  and 12.5 mg at bedtime 135 tablet 3   potassium chloride SA (KLOR-CON M20) 20 MEQ tablet TAKE 1 TABLET BY MOUTH EVERY DAY 90 tablet 3   torsemide (DEMADEX) 20 MG tablet TAKE 1 TABLET BY MOUTH TWICE A DAY 180 tablet 3   No current facility-administered medications for this visit.     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  Tobacco Use   Smoking status: Never   Smokeless tobacco: Never  Vaping Use   Vaping Use: Never used  Substance and Sexual Activity   Alcohol use: Yes    Comment: Rare   Drug use: Never   Sexual activity: Not on file  Other Topics Concern   Not on file  Social History Narrative   Right handed    Live with spouse    Social Determinants of Health   Financial Resource Strain: Low Risk   (08/22/2021)   Overall Financial Resource Strain (CARDIA)    Difficulty of Paying Living Expenses: Not hard at all  Food Insecurity: No Food Insecurity (08/22/2021)   Hunger Vital Sign    Worried About Running Out of Food in the Last Year: Never true    [place] in the Last Year: Never true  Transportation Needs: No Transportation Needs (08/22/2021)   PRAPARE - Hydrologist (Medical): No    Lack of Transportation (Non-Medical): No  Physical Activity: Not on file  Stress: Not on file  Social Connections: Not on file  Intimate Partner Violence: Not on file    Family History  Problem Relation Age of Onset   Sleep apnea Father    Diabetes Other     ROS: no fevers or chills, productive cough, hemoptysis, dysphasia, odynophagia, melena, hematochezia, dysuria, hematuria, rash, seizure activity, orthopnea, PND, pedal edema, claudication. Remaining systems are negative.  Physical Exam: Well-developed morbidly obese in no acute distress.  Skin is warm and dry.  HEENT is normal.  Neck is supple.  Chest is clear to auscultation with normal expansion.  Cardiovascular exam is regular rate and rhythm.  Abdominal exam nontender or distended. No masses palpated. Extremities show no edema. neuro grossly intact  A/P  1 chronic diastolic congestive heart failure he is euvolemic  on examination.  Continue diuretics and Farxiga at present dose. Check BMET.  2 Palpitations-felt possibly secondary to SVT.  Continue beta-blocker at present dose.  3 Morbid obesity-we discussed the importance of continued efforts at weight loss.  4 Obstructive sleep apnea-continue BiPAP.  Kirk Ruths, MD

## 2021-10-25 ENCOUNTER — Other Ambulatory Visit: Payer: Self-pay | Admitting: Podiatry

## 2021-10-30 ENCOUNTER — Ambulatory Visit: Payer: BC Managed Care – PPO | Admitting: Cardiology

## 2021-11-06 ENCOUNTER — Encounter: Payer: Self-pay | Admitting: Cardiology

## 2021-11-06 ENCOUNTER — Ambulatory Visit: Payer: BC Managed Care – PPO | Attending: Cardiology | Admitting: Cardiology

## 2021-11-06 VITALS — BP 112/68 | HR 71 | Ht 75.0 in | Wt >= 6400 oz

## 2021-11-06 DIAGNOSIS — R002 Palpitations: Secondary | ICD-10-CM

## 2021-11-06 DIAGNOSIS — I5032 Chronic diastolic (congestive) heart failure: Secondary | ICD-10-CM | POA: Diagnosis not present

## 2021-11-06 DIAGNOSIS — G4733 Obstructive sleep apnea (adult) (pediatric): Secondary | ICD-10-CM | POA: Diagnosis not present

## 2021-11-06 NOTE — Patient Instructions (Signed)
  Follow-Up: At [facility], you and your health needs are our priority.  As part of our continuing mission to provide you with exceptional heart care, we have created designated Provider Care Teams.  These Care Teams include your primary Cardiologist (physician) and Advanced Practice Providers (APPs -  Physician Assistants and Nurse Practitioners) who all work together to provide you with the care you need, when you need it.  We recommend signing up for the patient portal called "MyChart".  Sign up information is provided on this After Visit Summary.  MyChart is used to connect with patients for Virtual Visits (Telemedicine).  Patients are able to view lab/test results, encounter notes, upcoming appointments, etc.  Non-urgent messages can be sent to your provider as well.   To learn more about what you can do with MyChart, go to [email or web address].    Your next appointment:   6 month(s)  The format for your next appointment:   In Person  Provider:   Brian Crenshaw, MD    

## 2021-11-07 LAB — BASIC METABOLIC PANEL
BUN/Creatinine Ratio: 15 (ref 9–20)
BUN: 16 mg/dL (ref 6–20)
CO2: 23 mmol/L (ref 20–29)
Calcium: 9.3 mg/dL (ref 8.7–10.2)
Chloride: 103 mmol/L (ref 96–106)
Creatinine, Ser: 1.08 mg/dL (ref 0.76–1.27)
Glucose: 104 mg/dL — ABNORMAL HIGH (ref 70–99)
Potassium: 4.6 mmol/L (ref 3.5–5.2)
Sodium: 142 mmol/L (ref 134–144)
eGFR: 94 mL/min/{1.73_m2} (ref >59)

## 2021-11-12 ENCOUNTER — Encounter: Payer: Self-pay | Admitting: *Deleted

## 2021-11-26 ENCOUNTER — Ambulatory Visit: Payer: BC Managed Care – PPO | Admitting: Nurse Practitioner

## 2021-11-27 DIAGNOSIS — J029 Acute pharyngitis, unspecified: Secondary | ICD-10-CM | POA: Diagnosis not present

## 2021-11-27 DIAGNOSIS — J020 Streptococcal pharyngitis: Secondary | ICD-10-CM | POA: Diagnosis not present

## 2021-11-27 DIAGNOSIS — Z6841 Body Mass Index (BMI) 40.0 and over, adult: Secondary | ICD-10-CM | POA: Diagnosis not present

## 2022-02-04 ENCOUNTER — Ambulatory Visit: Payer: BC Managed Care – PPO | Attending: Cardiology

## 2022-02-07 DIAGNOSIS — M9902 Segmental and somatic dysfunction of thoracic region: Secondary | ICD-10-CM | POA: Diagnosis not present

## 2022-02-07 DIAGNOSIS — M9904 Segmental and somatic dysfunction of sacral region: Secondary | ICD-10-CM | POA: Diagnosis not present

## 2022-02-07 DIAGNOSIS — M5386 Other specified dorsopathies, lumbar region: Secondary | ICD-10-CM | POA: Diagnosis not present

## 2022-02-07 DIAGNOSIS — M9903 Segmental and somatic dysfunction of lumbar region: Secondary | ICD-10-CM | POA: Diagnosis not present

## 2022-02-07 DIAGNOSIS — M5414 Radiculopathy, thoracic region: Secondary | ICD-10-CM | POA: Diagnosis not present

## 2022-04-02 ENCOUNTER — Encounter: Payer: Self-pay | Admitting: Cardiology

## 2022-04-03 NOTE — Progress Notes (Signed)
HPI: FU palpitations. Echocardiogram October 2021 showed normal LV function.  Monitor November 2021 showed sinus rhythm with 32nd run of SVT and 4 beats of nonsustained ventricular tachycardia.  Toprol added.  Admitted with CHF symptoms June 2023.  Echocardiogram June 2023 showed normal LV function.  CTA June 2023 showed no pulmonary embolus.  Treated with diuretics with improvement.  Since last seen patient denies dyspnea, chest pain, palpitations or syncope.  Current Outpatient Medications  Medication Sig Dispense Refill   dapagliflozin propanediol (FARXIGA) 10 MG TABS tablet Take 1 tablet (10 mg total) by mouth daily. 90 tablet 3   metoprolol succinate (TOPROL-XL) 25 MG 24 hr tablet Take 25 mg  in the morning  and 12.5 mg at bedtime (Patient taking differently: Take 25 mg by mouth daily. Take 25 mg  in the morning) 135 tablet 3   potassium chloride SA (KLOR-CON M20) 20 MEQ tablet TAKE 1 TABLET BY MOUTH EVERY DAY 90 tablet 3   torsemide (DEMADEX) 20 MG tablet TAKE 1 TABLET BY MOUTH TWICE A DAY 180 tablet 3   No current facility-administered medications for this visit.     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  Tobacco Use   Smoking status: Never   Smokeless tobacco: Never  Vaping Use   Vaping Use: Never used  Substance and Sexual Activity   Alcohol use: Yes    Comment: Rare   Drug use: Never   Sexual activity: Not on file  Other Topics Concern   Not on file  Social History Narrative   Right handed    Live with spouse    Social  Determinants of Health   Financial Resource Strain: Low Risk  (08/22/2021)   Overall Financial Resource Strain (CARDIA)    Difficulty of Paying Living Expenses: Not hard at all  Food Insecurity: No Food Insecurity (08/22/2021)   Hunger Vital Sign    Worried About Running Out of Food in the Last Year: Never true    [place] in the Last Year: Never true  Transportation Needs: No Transportation Needs (08/22/2021)   PRAPARE - Hydrologist (Medical): No    Lack of Transportation (Non-Medical): No  Physical Activity: Not on file  Stress: Not on file  Social Connections: Not on file  Intimate Partner Violence: Not on file    Family History  Problem Relation Age of Onset   Sleep apnea Father    Diabetes Other     ROS: no fevers or chills, productive cough, hemoptysis, dysphasia, odynophagia, melena, hematochezia, dysuria, hematuria, rash, seizure activity, orthopnea, PND, pedal edema, claudication. Remaining systems are negative.  Physical Exam: Well-developed morbidly obese in no acute distress.  Skin is warm and dry.  HEENT is normal.  Neck is supple.  Chest is clear to auscultation with normal expansion.  Cardiovascular exam is regular rate and rhythm.  Abdominal exam nontender or distended. No masses palpated. Extremities show no edema. neuro grossly  intact   A/P  1 chronic diastolic congestive heart failure-patient remains euvolemic on examination.  Continue present dose of diuretic and Farxiga.  Check potassium and renal function.  2 palpitations-this was felt potentially to be SVT in the past.  Symptoms are controlled.  Will continue beta-blocker at present dose.  3 morbid obesity-we again discussed the importance of weight loss.  4 obstructive sleep apnea-continue BiPAP.  Zachary Fletcher present during exam  Zachary Ruths, MD

## 2022-04-10 ENCOUNTER — Other Ambulatory Visit: Payer: Self-pay

## 2022-04-10 ENCOUNTER — Other Ambulatory Visit (HOSPITAL_COMMUNITY): Payer: Self-pay

## 2022-04-10 MED ORDER — DAPAGLIFLOZIN PROPANEDIOL 10 MG PO TABS: 10.0000 mg | ORAL_TABLET | Freq: Every day | ORAL | 3 refills | Status: DC

## 2022-04-16 ENCOUNTER — Ambulatory Visit: Payer: BC Managed Care – PPO | Attending: Cardiology | Admitting: Cardiology

## 2022-04-16 ENCOUNTER — Encounter: Payer: Self-pay | Admitting: Cardiology

## 2022-04-16 VITALS — BP 120/60 | HR 60 | Ht 75.0 in | Wt >= 6400 oz

## 2022-04-16 DIAGNOSIS — I5032 Chronic diastolic (congestive) heart failure: Secondary | ICD-10-CM | POA: Diagnosis not present

## 2022-04-16 DIAGNOSIS — G4733 Obstructive sleep apnea (adult) (pediatric): Secondary | ICD-10-CM

## 2022-04-16 DIAGNOSIS — R002 Palpitations: Secondary | ICD-10-CM

## 2022-04-16 NOTE — Progress Notes (Signed)
Examination chaperoned by Maryan Rued, on 04/16/22 @ [phone]

## 2022-04-16 NOTE — Patient Instructions (Signed)
    Follow-Up: At [facility], you and your health needs are our priority.  As part of our continuing mission to provide you with exceptional heart care, we have created designated Provider Care Teams.  These Care Teams include your primary Cardiologist (physician) and Advanced Practice Providers (APPs -  Physician Assistants and Nurse Practitioners) who all work together to provide you with the care you need, when you need it.  We recommend signing up for the patient portal called "MyChart".  Sign up information is provided on this After Visit Summary.  MyChart is used to connect with patients for Virtual Visits (Telemedicine).  Patients are able to view lab/test results, encounter notes, upcoming appointments, etc.  Non-urgent messages can be sent to your provider as well.   To learn more about what you can do with MyChart, go to [email or web address].    Your next appointment:   12 month(s)  Provider:   Brian Crenshaw, MD     

## 2022-04-16 NOTE — Addendum Note (Signed)
Addended by: Cristopher Estimable on: 04/16/2022 09:39 AM   Modules accepted: Orders

## 2022-04-17 LAB — BASIC METABOLIC PANEL
BUN/Creatinine Ratio: 16 (ref 9–20)
BUN: 16 mg/dL (ref 6–20)
CO2: 25 mmol/L (ref 20–29)
Calcium: 9.2 mg/dL (ref 8.7–10.2)
Chloride: 101 mmol/L (ref 96–106)
Creatinine, Ser: 0.97 mg/dL (ref 0.76–1.27)
Glucose: 116 mg/dL — ABNORMAL HIGH (ref 70–99)
Potassium: 4.3 mmol/L (ref 3.5–5.2)
Sodium: 140 mmol/L (ref 134–144)
eGFR: 107 mL/min/{1.73_m2} (ref >59)

## 2022-04-21 ENCOUNTER — Encounter: Payer: Self-pay | Admitting: *Deleted

## 2022-05-06 DIAGNOSIS — R051 Acute cough: Secondary | ICD-10-CM | POA: Diagnosis not present

## 2022-05-06 DIAGNOSIS — J00 Acute nasopharyngitis [common cold]: Secondary | ICD-10-CM | POA: Diagnosis not present

## 2022-05-06 DIAGNOSIS — Z6841 Body Mass Index (BMI) 40.0 and over, adult: Secondary | ICD-10-CM | POA: Diagnosis not present

## 2022-05-06 DIAGNOSIS — I5032 Chronic diastolic (congestive) heart failure: Secondary | ICD-10-CM | POA: Diagnosis not present

## 2022-05-12 ENCOUNTER — Ambulatory Visit
Admission: EM | Admit: 2022-05-12 | Discharge: 2022-05-12 | Disposition: A | Discharge status: Home / Self Care | Payer: BC Managed Care – PPO | Attending: Nurse Practitioner | Admitting: Nurse Practitioner

## 2022-05-12 ENCOUNTER — Ambulatory Visit (INDEPENDENT_AMBULATORY_CARE_PROVIDER_SITE_OTHER): Payer: BC Managed Care – PPO

## 2022-05-12 DIAGNOSIS — R051 Acute cough: Secondary | ICD-10-CM

## 2022-05-12 DIAGNOSIS — J208 Acute bronchitis due to other specified organisms: Secondary | ICD-10-CM | POA: Diagnosis not present

## 2022-05-12 DIAGNOSIS — R059 Cough, unspecified: Secondary | ICD-10-CM | POA: Diagnosis not present

## 2022-05-12 DIAGNOSIS — B9689 Other specified bacterial agents as the cause of diseases classified elsewhere: Secondary | ICD-10-CM | POA: Diagnosis not present

## 2022-05-12 MED ORDER — AZITHROMYCIN 250 MG PO TABS: 250.0000 mg | ORAL_TABLET | Freq: Every day | ORAL | 0 refills | Status: AC

## 2022-05-12 MED ORDER — BENZONATATE 200 MG PO CAPS: 200.0000 mg | ORAL_CAPSULE | Freq: Three times a day (TID) | ORAL | 0 refills | Status: DC | PRN

## 2022-05-12 NOTE — ED Provider Notes (Signed)
[facility]    CSN: [id number] Arrival date & time: 05/12/22  1658      History   Chief Complaint Chief Complaint  Patient presents with   Cough   Headache    HPI Zachary Fletcher is a 32 y.o. male  presents for evaluation of URI symptoms for 14 days. Patient reports associated symptoms of productive cough.  States he had fevers initially as well as sore throat but this is since resolved.  Denies N/V/D, fevers, sore throat, chills, body aches, shortness of breath. Patient does not have a hx of asthma or smoking.  Wife had the flu.  Pt has taken DayQuil NyQuil OTC for symptoms.  Patient does have a history of CHF.  Denies weight gain, orthopnea, or lower extremity swelling.  Pt has no other concerns at this time.    Cough Headache Associated symptoms: cough     Past Medical History:  Diagnosis Date   Sleep apnea, obstructive    Tachycardia     Patient Active Problem List   Diagnosis Date Noted   SVT (supraventricular tachycardia)    Elevated troponin    Acute on chronic diastolic CHF (congestive heart failure) (HCC)    NSVT (nonsustained ventricular tachycardia) (HCC)    CHF (congestive heart failure) ([place]) 08/20/2021   Arthropathy, multiple sites 06/25/2021   Essential hypertension 06/25/2021   Neuroma of foot 06/25/2021   Morbid obesity ([place]) 11/19/2020   Neuropathy 11/19/2020   OSA on CPAP 11/19/2020   Left groin pain 11/19/2020   Prediabetes 11/19/2020    Past Surgical History:  Procedure Laterality Date   No prior surgery         Home Medications    Prior to Admission medications   Medication Sig Start Date End Date Taking? Authorizing Provider  azithromycin (ZITHROMAX Z-PAK) 250 MG tablet Take 1 tablet (250 mg total) by mouth daily for 6 doses. Take 2 tablets by mouth on day 1 then 1 tablet daily by mouth for 4 days 05/12/22 05/18/22 Yes Melynda Ripple, NP  benzonatate (TESSALON) 200 MG capsule Take 1 capsule (200 mg total) by mouth 3 (three)  times daily as needed for cough. 05/12/22  Yes Melynda Ripple, NP  dapagliflozin propanediol (FARXIGA) 10 MG TABS tablet Take 1 tablet (10 mg total) by mouth daily. 04/10/22   Lelon Perla, MD  metoprolol succinate (TOPROL-XL) 25 MG 24 hr tablet Take 25 mg  in the morning  and 12.5 mg at bedtime Patient taking differently: Take 25 mg by mouth daily. Take 25 mg  in the morning 10/10/21   Troy Sine, MD  potassium chloride SA (KLOR-CON M20) 20 MEQ tablet TAKE 1 TABLET BY MOUTH EVERY DAY 10/15/21   Troy Sine, MD  torsemide (DEMADEX) 20 MG tablet TAKE 1 TABLET BY MOUTH TWICE A DAY 10/15/21   Troy Sine, MD    Family History Family History  Problem Relation Age of Onset   Sleep apnea Father    Diabetes Other     Social History Social History   Tobacco Use   Smoking status: Never   Smokeless tobacco: Never  Vaping Use   Vaping Use: Never used  Substance Use Topics   Alcohol use: Yes    Comment: Rare   Drug use: Never     Allergies   Patient has no known allergies.   Review of Systems Review of Systems  Respiratory:  Positive for cough.      Physical Exam  Triage Vital Signs ED Triage Vitals [05/12/22 1705]  Enc Vitals Group     BP 124/80     Pulse Rate 72     Resp 18     Temp 98 F (36.7 C)     Temp Source Oral     SpO2 94 %     Weight      Height      Head Circumference      Peak Flow      Pain Score 5     Pain Loc      Pain Edu?      Excl. in [place]?    No data found.  Updated Vital Signs BP 124/80 (BP Location: Right Arm)   Pulse 72   Temp 98 F (36.7 C) (Oral)   Resp 18   SpO2 94%   Visual Acuity Right Eye Distance:   Left Eye Distance:   Bilateral Distance:    Right Eye Near:   Left Eye Near:    Bilateral Near:     Physical Exam Vitals and nursing note reviewed.  Constitutional:      General: He is not in acute distress.    Appearance: Normal appearance. He is not ill-appearing or toxic-appearing.  HENT:     Head: Normocephalic  and atraumatic.     Right Ear: Tympanic membrane and ear canal normal.     Left Ear: Tympanic membrane and ear canal normal.     Nose: Congestion present.     Mouth/Throat:     Mouth: Mucous membranes are moist.     Pharynx: No posterior oropharyngeal erythema.  Eyes:     Pupils: Pupils are equal, round, and reactive to light.  Cardiovascular:     Rate and Rhythm: Normal rate and regular rhythm.     Heart sounds: Normal heart sounds.  Pulmonary:     Effort: Pulmonary effort is normal.     Breath sounds: Normal breath sounds.  Musculoskeletal:     Cervical back: Normal range of motion and neck supple.  Lymphadenopathy:     Cervical: No cervical adenopathy.  Skin:    General: Skin is warm and dry.  Neurological:     General: No focal deficit present.     Mental Status: He is alert and oriented to person, place, and time.  Psychiatric:        Mood and Affect: Mood normal.        Behavior: Behavior normal.      UC Treatments / Results  Labs (all labs ordered are listed, but only abnormal results are displayed) Labs Reviewed - No data to display  EKG   Radiology DG Chest 2 View  Result Date: 05/12/2022 CLINICAL DATA:  cough x 2 weeks EXAM: CHEST - 2 VIEW COMPARISON:  08/20/2021 FINDINGS: Stable cardiomegaly. Limited exam because of body habitus and motion artifact. No significant focal pneumonia, collapse or consolidation. No edema pattern, large effusion or pneumothorax. Trachea midline. IMPRESSION: Cardiomegaly without acute process. Electronically Signed   By: Jerilynn Mages.  Shick M.D.   On: 05/12/2022 17:35    Procedures Procedures (including critical care time)  Medications Ordered in UC Medications - No data to display  Initial Impression / Assessment and Plan / UC Course  I have reviewed the triage vital signs and the nursing notes.  Pertinent labs & imaging results that were available during my care of the patient were reviewed by me and considered in my medical decision  making (see chart for details).  Reviewed exam and symptoms with patient.  Chest x-ray negative for pneumonia and Will treat for bronchitis with Zithromax Tessalon.  Cough Rest and fluids PCP follow-up 2 to 3 days for recheck ER precautions reviewed and patient verbalized understanding Final Clinical Impressions(s) / UC Diagnoses   Final diagnoses:  Acute cough  Acute bacterial bronchitis     Discharge Instructions      Tessalon as needed for cough Zithromax as prescribed Rest and fluids Follow-up with your PCP if symptoms do not improve Please go to the emergency room for any worsening symptoms     ED Prescriptions     Medication Sig Dispense Auth. Provider   benzonatate (TESSALON) 200 MG capsule Take 1 capsule (200 mg total) by mouth 3 (three) times daily as needed for cough. 20 capsule Melynda Ripple, NP   azithromycin (ZITHROMAX Z-PAK) 250 MG tablet Take 1 tablet (250 mg total) by mouth daily for 6 doses. Take 2 tablets by mouth on day 1 then 1 tablet daily by mouth for 4 days 6 tablet Melynda Ripple, NP      PDMP not reviewed this encounter.   Melynda Ripple, NP 05/12/22 [phone]

## 2022-05-12 NOTE — Discharge Instructions (Signed)
Tessalon as needed for cough Zithromax as prescribed Rest and fluids Follow-up with your PCP if symptoms do not improve Please go to the emergency room for any worsening symptoms

## 2022-05-12 NOTE — ED Triage Notes (Signed)
Pt presents with a cough x 2 weeks.   States he has taken dayquil, nyquil but states it has not helped.

## 2022-06-01 ENCOUNTER — Encounter: Payer: Self-pay | Admitting: Cardiology

## 2022-06-03 ENCOUNTER — Ambulatory Visit
Admission: EM | Admit: 2022-06-03 | Discharge: 2022-06-03 | Disposition: A | Discharge status: Home / Self Care | Payer: BC Managed Care – PPO | Attending: Urgent Care | Admitting: Urgent Care

## 2022-06-03 ENCOUNTER — Ambulatory Visit (INDEPENDENT_AMBULATORY_CARE_PROVIDER_SITE_OTHER): Payer: BC Managed Care – PPO

## 2022-06-03 DIAGNOSIS — R059 Cough, unspecified: Secondary | ICD-10-CM | POA: Diagnosis not present

## 2022-06-03 DIAGNOSIS — R0989 Other specified symptoms and signs involving the circulatory and respiratory systems: Secondary | ICD-10-CM

## 2022-06-03 DIAGNOSIS — J209 Acute bronchitis, unspecified: Secondary | ICD-10-CM

## 2022-06-03 DIAGNOSIS — R053 Chronic cough: Secondary | ICD-10-CM

## 2022-06-03 MED ORDER — PREDNISONE 50 MG PO TABS: 50.0000 mg | ORAL_TABLET | Freq: Every day | ORAL | 0 refills | Status: DC

## 2022-06-03 MED ORDER — PROMETHAZINE-DM 6.25-15 MG/5ML PO SYRP: 5.0000 mL | ORAL_SOLUTION | Freq: Three times a day (TID) | ORAL | 0 refills | Status: DC | PRN

## 2022-06-03 NOTE — ED Provider Notes (Addendum)
[facility]  Note:  This document was prepared using Systems analyst and may include unintentional dictation errors.  MRN: [id number] DOB: 07/08/1990  Subjective:   Zachary Fletcher is a 32 y.o. male presenting for 3-week history of persistent productive cough, chest congestion.  Feels like he has blood-tinged mucus.  Patient is not a smoker of any kind.  He does have a history of acute on chronic diastolic congestive heart failure.  Last echocardiogram showed an ejection fraction of 60% to 65% from August 21, 2021.  Patient does take torsemide.  He also has a history of SVT.  No current facility-administered medications for this encounter.  Current Outpatient Medications:    benzonatate (TESSALON) 200 MG capsule, Take 1 capsule (200 mg total) by mouth 3 (three) times daily as needed for cough., Disp: 20 capsule, Rfl: 0   dapagliflozin propanediol (FARXIGA) 10 MG TABS tablet, Take 1 tablet (10 mg total) by mouth daily., Disp: 90 tablet, Rfl: 3   metoprolol succinate (TOPROL-XL) 25 MG 24 hr tablet, Take 25 mg  in the morning  and 12.5 mg at bedtime (Patient taking differently: Take 25 mg by mouth daily. Take 25 mg  in the morning), Disp: 135 tablet, Rfl: 3   potassium chloride SA (KLOR-CON M20) 20 MEQ tablet, TAKE 1 TABLET BY MOUTH EVERY DAY, Disp: 90 tablet, Rfl: 3   torsemide (DEMADEX) 20 MG tablet, TAKE 1 TABLET BY MOUTH TWICE A DAY, Disp: 180 tablet, Rfl: 3   No Known Allergies  Past Medical History:  Diagnosis Date   Sleep apnea, obstructive    Tachycardia      Past Surgical History:  Procedure Laterality Date   No prior surgery      Family History  Problem Relation Age of Onset   Sleep apnea Father    Diabetes Other     Social History   Tobacco Use   Smoking status: Never   Smokeless tobacco: Never  Vaping Use   Vaping Use: Never used  Substance Use Topics   Alcohol use: Yes    Comment: Rare   Drug use: Never     ROS   Objective:   Vitals: BP 131/79 (BP Location: Left Arm)   Pulse 79   Temp 98.5 F (36.9 C) (Oral)   Resp 18   SpO2 97%   Physical Exam Constitutional:      General: He is not in acute distress.    Appearance: Normal appearance. He is well-developed and normal weight. He is not ill-appearing, toxic-appearing or diaphoretic.  HENT:     Head: Normocephalic and atraumatic.     Right Ear: External ear normal.     Left Ear: External ear normal.     Nose: Nose normal.     Mouth/Throat:     Mouth: Mucous membranes are moist.  Eyes:     General: No scleral icterus.       Right eye: No discharge.        Left eye: No discharge.     Extraocular Movements: Extraocular movements intact.  Cardiovascular:     Rate and Rhythm: Normal rate and regular rhythm.     Heart sounds: Normal heart sounds. No murmur heard.    No friction rub. No gallop.  Pulmonary:     Breath sounds: No wheezing.     Comments: Decreased lung sounds likely due to body habitus. Musculoskeletal:     Cervical back: Normal range of motion.  Neurological:  Mental Status: He is alert and oriented to person, place, and time.  Psychiatric:        Mood and Affect: Mood normal.        Behavior: Behavior normal.        Thought Content: Thought content normal.        Judgment: Judgment normal.     Assessment and Plan :   PDMP not reviewed this encounter.  1. Persistent cough   2. Chest congestion     X-ray over-read was pending at time of discharge, recommended follow up with only abnormal results. Otherwise will not call for negative over-read. Patient was in agreement.  Will update treatment plan following his x-ray over read.   Zachary Fletcher, [place] 06/03/22 1849  UPDATE: DG Chest 2 View  Result Date: 06/03/2022 CLINICAL DATA:  Cough and bronchitis. Bronchitis 3 weeks ago. Cough persists. EXAM: CHEST - 2 VIEW COMPARISON:  05/12/2022 FINDINGS: Normal heart size and pulmonary vascularity. Mild  peribronchial thickening with streaky perihilar opacities consistent with bronchitis. No focal consolidation. No pleural effusions. No pneumothorax. Mediastinal contours appear intact. IMPRESSION: Mild peribronchial and perihilar changes suggesting bronchitis. No focal consolidation. Electronically Signed   By: Lucienne Capers M.D.   On: 06/03/2022 19:08    Will manage with prednisone, hold off on further antibiotic use. Use supportive care otherwise. Counseled patient on potential for adverse effects with medications prescribed/recommended today, ER and return-to-clinic precautions discussed, patient verbalized understanding.    Zachary Fletcher, [place] 06/03/22 1929

## 2022-06-03 NOTE — ED Triage Notes (Signed)
Pt states dx'd and tx'd for bronchitis here 3 wks ago. States cough has never went away and now getting worse.

## 2022-06-03 NOTE — ED Triage Notes (Deleted)
Pt not answered in lobby.   Left voice message.

## 2022-06-03 NOTE — Discharge Instructions (Addendum)
Please start using prednisone for your bronchitis. Use cough syrup as needed.

## 2022-06-05 ENCOUNTER — Other Ambulatory Visit: Payer: Self-pay | Admitting: Cardiology

## 2022-06-09 MED ORDER — DAPAGLIFLOZIN PROPANEDIOL 10 MG PO TABS: 10.0000 mg | ORAL_TABLET | Freq: Every day | ORAL | 3 refills | Status: DC

## 2022-06-09 MED ORDER — TORSEMIDE 20 MG PO TABS: 20.0000 mg | ORAL_TABLET | Freq: Two times a day (BID) | ORAL | 3 refills | Status: DC

## 2022-06-09 MED ORDER — METOPROLOL SUCCINATE ER 25 MG PO TB24: ORAL_TABLET | ORAL | 3 refills | Status: DC

## 2022-06-09 MED ORDER — POTASSIUM CHLORIDE CRYS ER 20 MEQ PO TBCR: 20.0000 meq | EXTENDED_RELEASE_TABLET | Freq: Every day | ORAL | 3 refills | Status: DC

## 2022-06-10 IMAGING — DX DG LUMBAR SPINE COMPLETE 4+V
4 series · 4 of 4 positions shown · non-contrast
Comparison: None.

CLINICAL DATA: 29-year-old presenting with unexplained groin pain
since December 2019.

EXAM:
LUMBAR SPINE - COMPLETE 4+ VIEW

[lumbar spine ap]
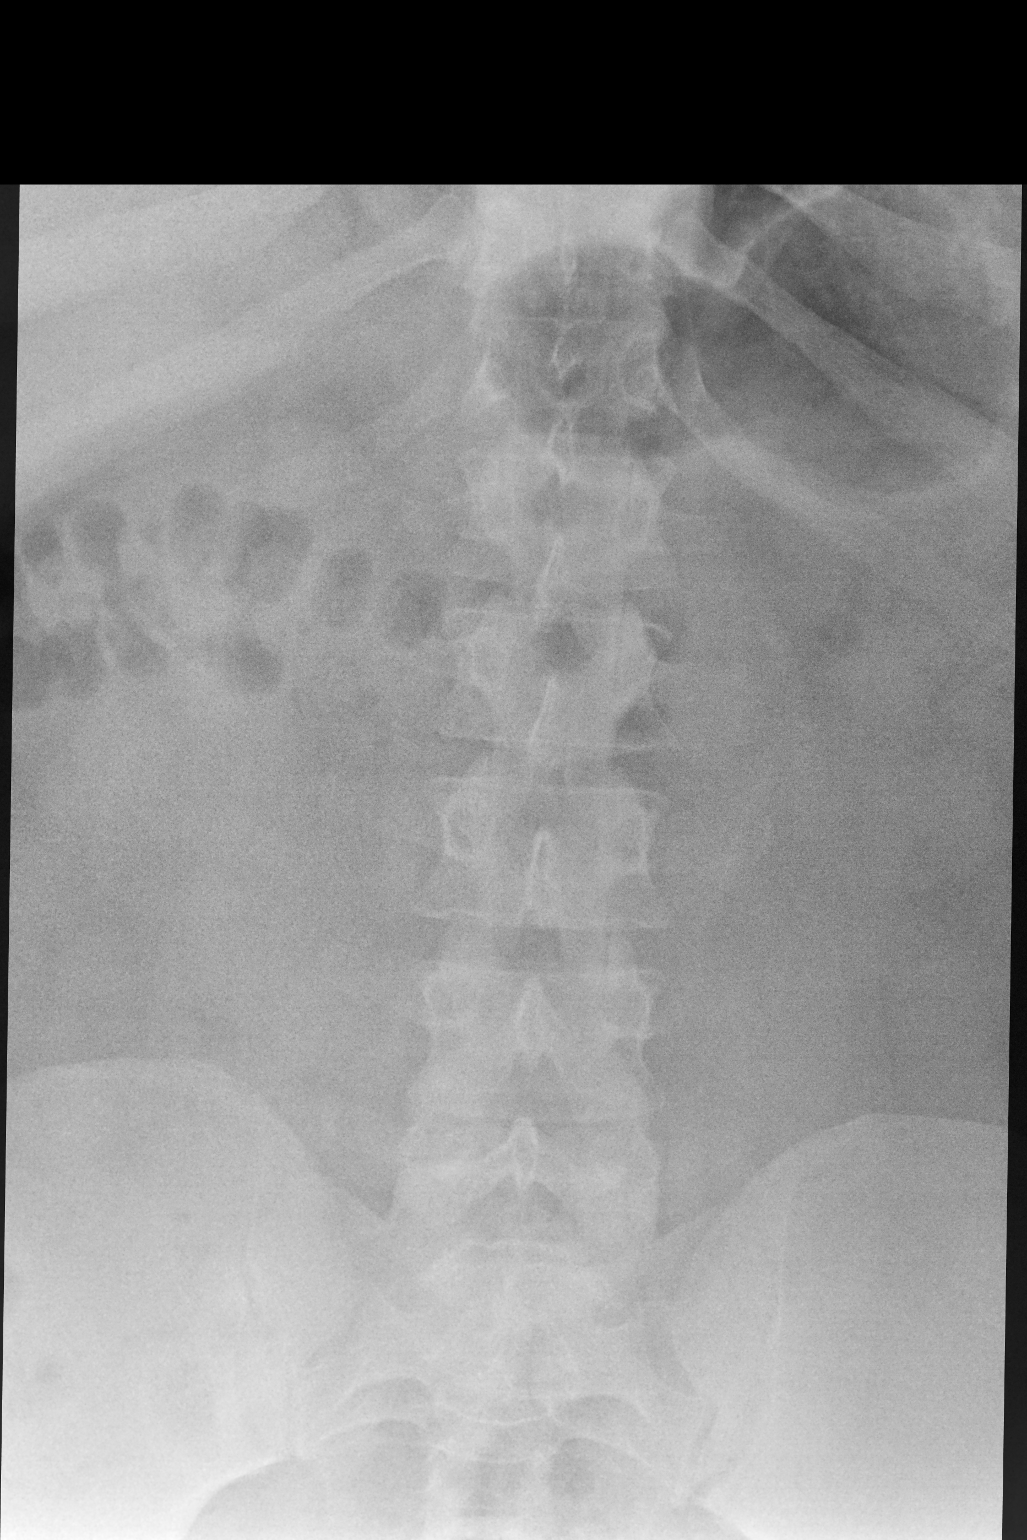

[lumbar spine lmo (1 of 2)]
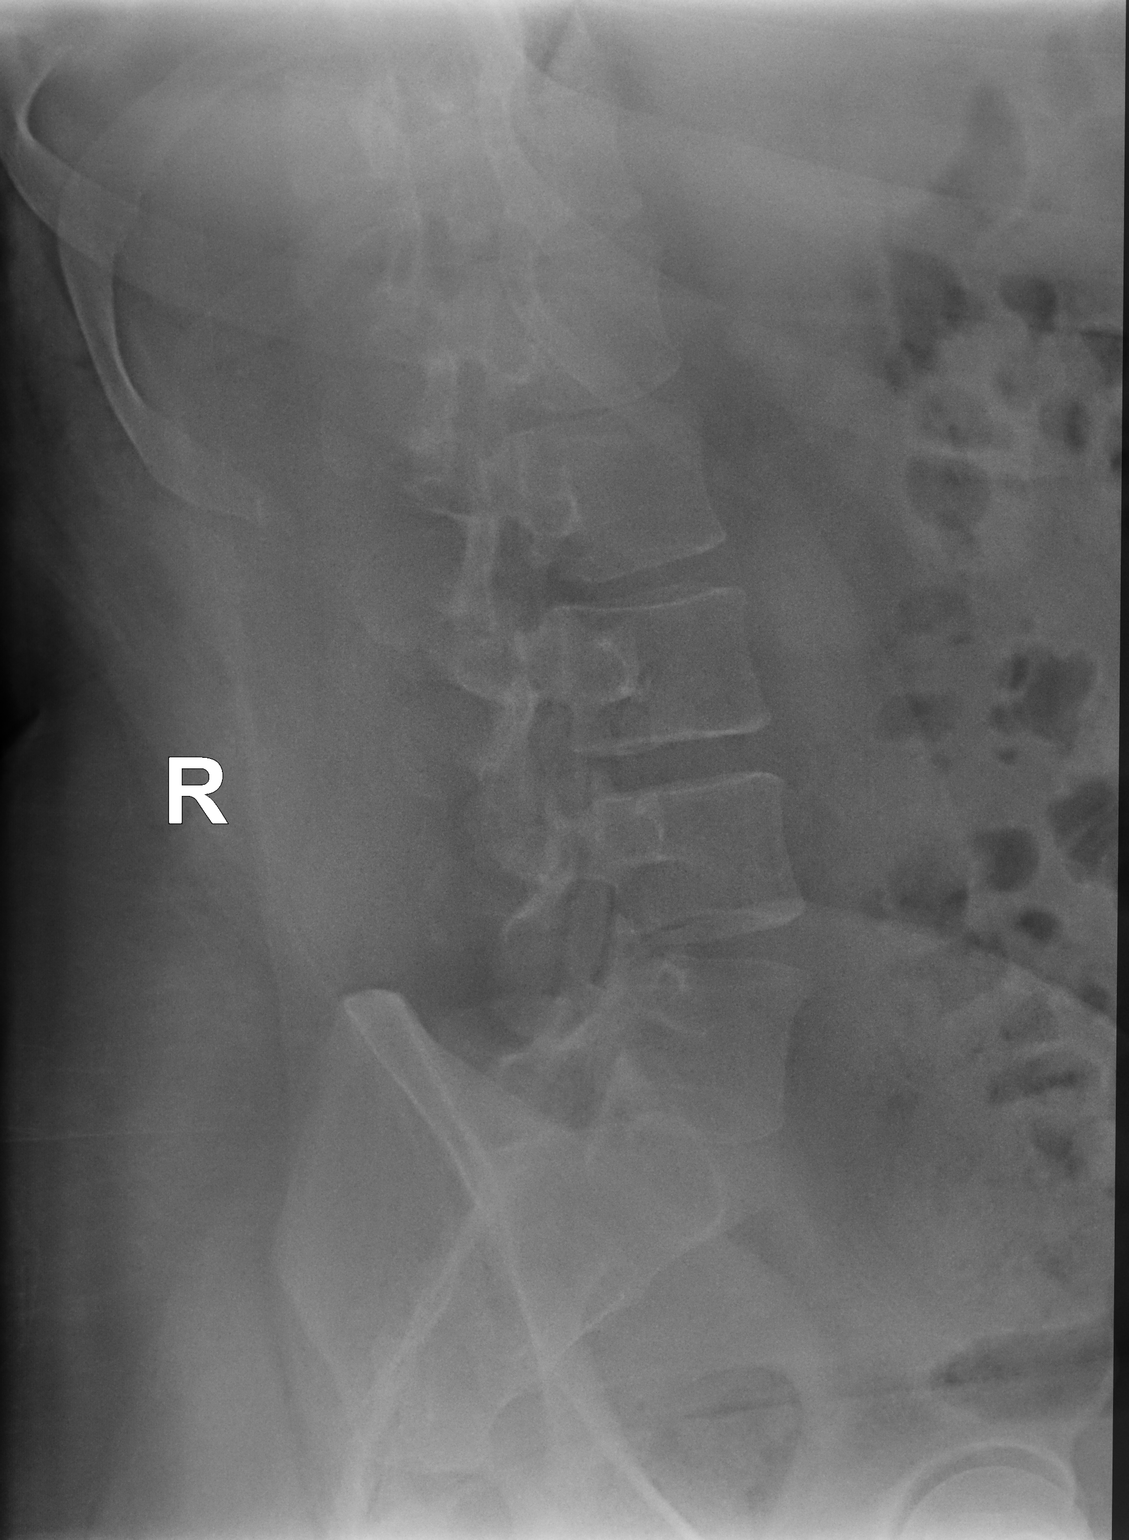

[lumbar spine lmo (2 of 2)]
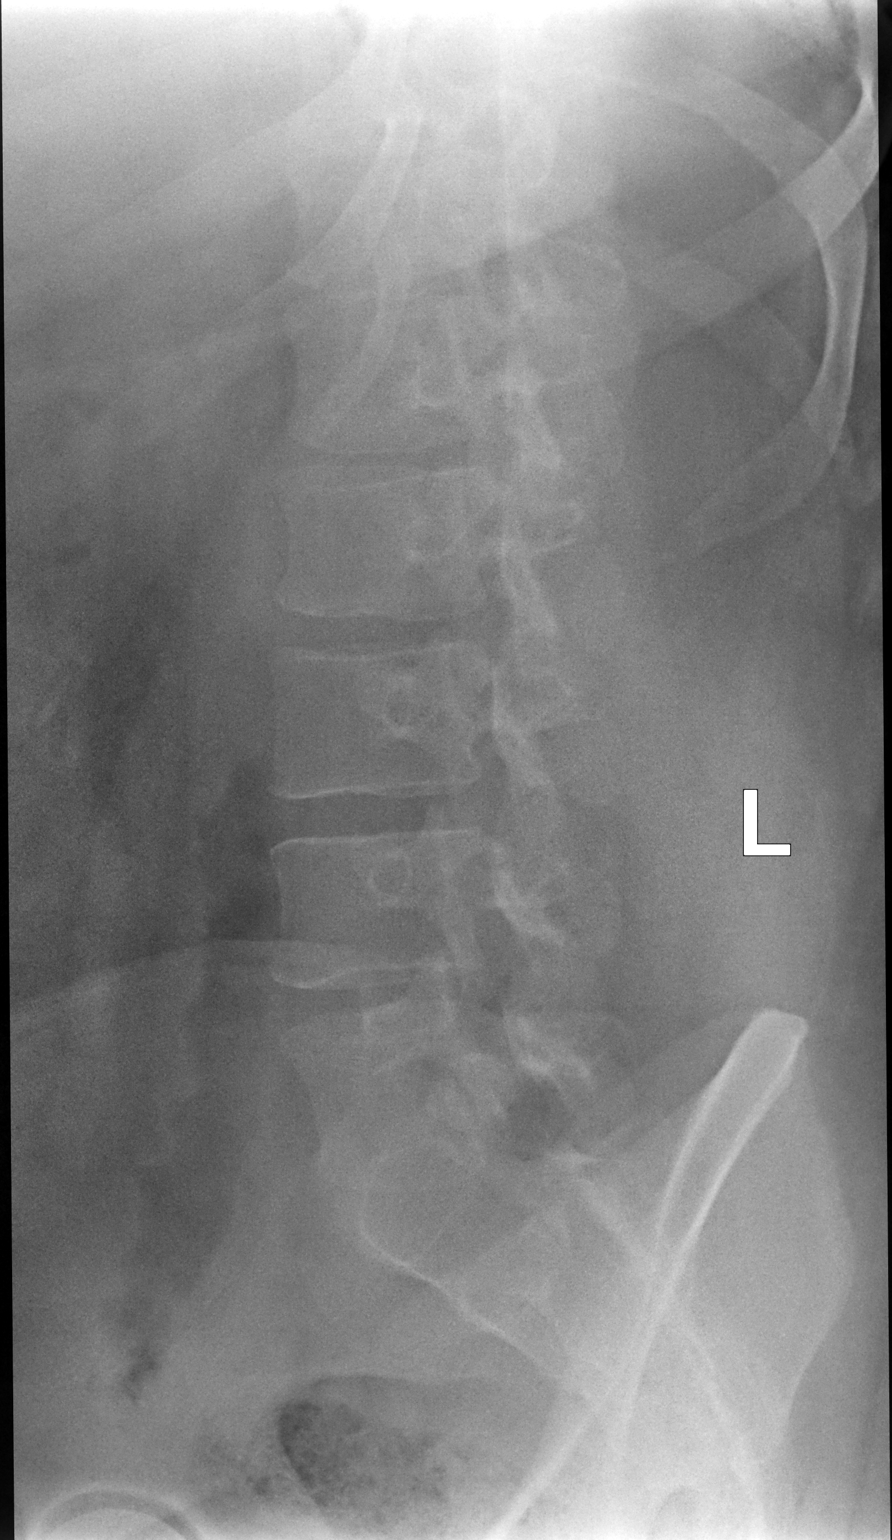

[lumbar spine lat]
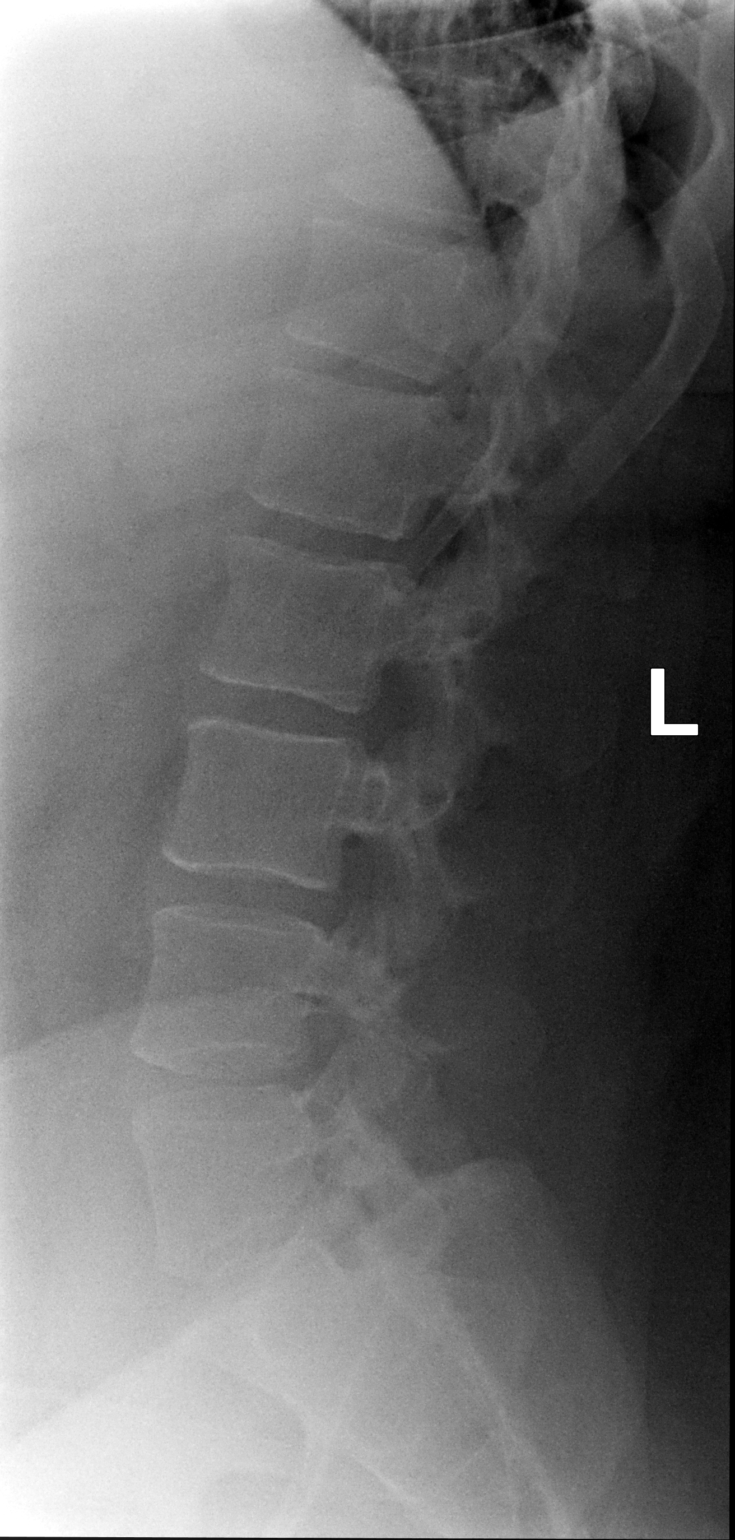

[4 of 4 positions shown; findings below may reference images not displayed]

FINDINGS: Images are underexposed. Five non rib-bearing lumbar vertebrae with
anatomic alignment. No fractures. Well-preserved disk spaces. No
pars defects. No significant facet arthropathy. No significant
spondylosis. Visualized sacroiliac joints intact.
IMPRESSION: Normal examination.

## 2022-07-15 MED ORDER — POTASSIUM CHLORIDE CRYS ER 20 MEQ PO TBCR: 20.0000 meq | EXTENDED_RELEASE_TABLET | Freq: Every day | ORAL | 3 refills | Status: DC

## 2022-07-15 MED ORDER — DAPAGLIFLOZIN PROPANEDIOL 10 MG PO TABS: 10.0000 mg | ORAL_TABLET | Freq: Every day | ORAL | 3 refills | Status: DC

## 2022-07-15 MED ORDER — TORSEMIDE 20 MG PO TABS: 20.0000 mg | ORAL_TABLET | Freq: Two times a day (BID) | ORAL | 3 refills | Status: DC

## 2022-07-15 NOTE — Addendum Note (Signed)
Addended by: Freddi Starr on: 07/15/2022 10:30 AM   Modules accepted: Orders

## 2022-09-14 ENCOUNTER — Other Ambulatory Visit: Payer: Self-pay | Admitting: Physician Assistant

## 2022-09-15 ENCOUNTER — Telehealth: Payer: Self-pay | Admitting: Cardiology

## 2022-09-15 NOTE — Telephone Encounter (Signed)
*  STAT* If patient is at the pharmacy, call can be transferred to refill team.   1. Which medications need to be refilled? (please list name of each medication and dose if known) dapagliflozin propanediol (FARXIGA) 10 MG TABS tablet   2. Which pharmacy/location (including street and city if local pharmacy) is medication to be sent to?  [id number] [place], [place] - [address] AT [address]OSS [address]    3. Do they need a 30 day or 90 day supply? 90

## 2022-09-15 NOTE — Telephone Encounter (Signed)
Pt c/o medication issue:  1. Name of Medication: metoprolol succinate (TOPROL-XL) 25 MG 24 hr tablet   potassium chloride SA (KLOR-CON M20) 20 MEQ tablet    torsemide (DEMADEX) 20 MG tablet   dapagliflozin propanediol (FARXIGA) 10 MG TABS tablet     2. How are you currently taking this medication (dosage and times per day)? As prescribed   3. Are you having a reaction (difficulty breathing--STAT)?   4. What is your medication issue? Patient is requesting meds sent to: [facility] - [place], [place] - [address]    [facility] for [facility], but after that fill he would like for it to go to [facility].    Requesting call back from nurse.

## 2022-09-16 ENCOUNTER — Other Ambulatory Visit: Payer: Self-pay

## 2022-09-16 MED ORDER — METOPROLOL SUCCINATE ER 25 MG PO TB24: ORAL_TABLET | ORAL | 3 refills | Status: DC

## 2022-09-16 MED ORDER — TORSEMIDE 20 MG PO TABS: 20.0000 mg | ORAL_TABLET | Freq: Two times a day (BID) | ORAL | 3 refills | Status: DC

## 2022-09-16 MED ORDER — POTASSIUM CHLORIDE CRYS ER 20 MEQ PO TBCR: 20.0000 meq | EXTENDED_RELEASE_TABLET | Freq: Every day | ORAL | 3 refills | Status: DC

## 2022-09-16 MED ORDER — DAPAGLIFLOZIN PROPANEDIOL 10 MG PO TABS: 10.0000 mg | ORAL_TABLET | Freq: Every day | ORAL | 11 refills | Status: DC

## 2022-09-16 NOTE — Telephone Encounter (Signed)
Called patient to advised prescription refills sent to pharmacy. Left message for patient.

## 2022-09-17 ENCOUNTER — Other Ambulatory Visit: Payer: Self-pay

## 2022-09-17 MED ORDER — DAPAGLIFLOZIN PROPANEDIOL 10 MG PO TABS: 10.0000 mg | ORAL_TABLET | Freq: Every day | ORAL | 2 refills | Status: DC

## 2022-09-17 NOTE — Telephone Encounter (Signed)
Pt's medication was resent to pt's preferred pharmacy as requested. Confirmation received.  °

## 2022-10-30 ENCOUNTER — Other Ambulatory Visit: Payer: Self-pay | Admitting: Cardiovascular Disease

## 2022-12-16 ENCOUNTER — Telehealth: Payer: Self-pay | Admitting: Cardiology

## 2022-12-16 MED ORDER — DAPAGLIFLOZIN PROPANEDIOL 10 MG PO TABS: 10.0000 mg | ORAL_TABLET | Freq: Every day | ORAL | 1 refills | Status: DC

## 2022-12-16 NOTE — Telephone Encounter (Signed)
Pt's medication was sent to pt's pharmacy as requested. Confirmation received.  °

## 2022-12-16 NOTE — Telephone Encounter (Signed)
*  STAT* If patient is at the pharmacy, call can be transferred to refill team.   1. Which medications need to be refilled? (please list name of each medication and dose if known) Farixiga   2. Would you like to learn more about the convenience, safety, & potential cost savings by using the [facility]?     3. Are you open to using the [facility] (Type [facility].    4. Which pharmacy/location (including street and city if local pharmacy) is medication to be sent to?[facility] [address] [place]   5. Do they need a 30 day or 90 day supply? 90 days and refills- please call-out of medicine

## 2022-12-19 ENCOUNTER — Other Ambulatory Visit: Payer: Self-pay | Admitting: Cardiovascular Disease

## 2022-12-19 ENCOUNTER — Telehealth: Payer: Self-pay | Admitting: Cardiology

## 2022-12-19 NOTE — Telephone Encounter (Signed)
Called pt. He states the pharmacy needs in formation fro there prescriber. Pt was not able to give me the information they needed. Pt gave this nurse updated insurance information. Pt advised I can notate the numbers, however we would need a see the card. He states "it's all digital  Called pt's pharmacy, spoke with staff she stated the medication needed a prior auth and after that was completed they can fill the prescription.  Name: Zachary Fletcher  Group id : [id number] Member id: [id number] CS Num: [phone] Activated: Sept 1st

## 2022-12-19 NOTE — Telephone Encounter (Signed)
Pt c/o medication issue:  1. Name of Medication:  dapagliflozin propanediol (FARXIGA) 10 MG TABS tablet  2. How are you currently taking this medication (dosage and times per day)?   3. Are you having a reaction (difficulty breathing--STAT)?   4. What is your medication issue?    Patient states the pharmacy informed him that the insurance needs to be updated?/authenticated for [facility] prescription. He also mentions that he no longer has [employer]BS [place] and he is currently insured with [employer].

## 2022-12-25 NOTE — Telephone Encounter (Signed)
Left voicemail stating that I have sent this information to our PA team and hopefully we will know something soon. Apologized for the delay. We will be in touch with updates. Left call back number

## 2022-12-25 NOTE — Telephone Encounter (Signed)
Forwarding to PA team

## 2022-12-25 NOTE — Telephone Encounter (Signed)
Patient's wife is following up. She would like to know if PA has been completed. Please advise.

## 2022-12-26 ENCOUNTER — Other Ambulatory Visit (HOSPITAL_COMMUNITY): Payer: Self-pay

## 2022-12-26 ENCOUNTER — Telehealth: Payer: Self-pay | Admitting: Pharmacy Technician

## 2022-12-26 NOTE — Telephone Encounter (Signed)
Pharmacy Patient Advocate Encounter   Received notification from Pt Calls Messages that prior authorization for farxiga is required/requested.   Insurance verification completed.   The patient is insured through [facility] .   Per test claim: PA required; PA submitted to [facility] via CoverMyMeds Key/confirmation #/[id number] Status is pending

## 2022-12-26 NOTE — Telephone Encounter (Signed)
Pharmacy Patient Advocate Encounter  Received notification from [facility] that Prior Authorization for farxiga has been APPROVED from 12/26/22 to 12/26/23   PA #/Case ID/Reference #: [id number] [facility]

## 2022-12-29 MED ORDER — DAPAGLIFLOZIN PROPANEDIOL 10 MG PO TABS: 10.0000 mg | ORAL_TABLET | Freq: Every day | ORAL | 3 refills | Status: DC

## 2022-12-29 NOTE — Telephone Encounter (Signed)
Called to notify the patient of PA approved. He also asked me to send in RX for 90 days worth instead of 30. I sent in the refill request for 90 with 3 refills.   Informed him to let us know if he needed anything else.

## 2022-12-29 NOTE — Addendum Note (Signed)
Addended by: Scheryl Marten on: 12/29/2022 12:49 PM   Modules accepted: Orders

## 2023-01-08 ENCOUNTER — Telehealth: Payer: Self-pay | Admitting: Nurse Practitioner

## 2023-01-08 NOTE — Telephone Encounter (Signed)
Lvmtcb to schedule appt

## 2023-01-11 ENCOUNTER — Other Ambulatory Visit: Payer: Self-pay

## 2023-01-11 ENCOUNTER — Encounter: Payer: Self-pay | Admitting: Emergency Medicine

## 2023-01-11 ENCOUNTER — Ambulatory Visit: Payer: 59

## 2023-01-11 ENCOUNTER — Ambulatory Visit
Admission: EM | Admit: 2023-01-11 | Discharge: 2023-01-11 | Disposition: A | Discharge status: Home / Self Care | Payer: 59 | Attending: Family Medicine | Admitting: Family Medicine

## 2023-01-11 DIAGNOSIS — J309 Allergic rhinitis, unspecified: Secondary | ICD-10-CM

## 2023-01-11 DIAGNOSIS — J40 Bronchitis, not specified as acute or chronic: Secondary | ICD-10-CM

## 2023-01-11 DIAGNOSIS — J329 Chronic sinusitis, unspecified: Secondary | ICD-10-CM | POA: Diagnosis not present

## 2023-01-11 DIAGNOSIS — R059 Cough, unspecified: Secondary | ICD-10-CM | POA: Diagnosis not present

## 2023-01-11 DIAGNOSIS — R052 Subacute cough: Secondary | ICD-10-CM | POA: Diagnosis not present

## 2023-01-11 MED ORDER — HYDROCODONE BIT-HOMATROP MBR 5-1.5 MG/5ML PO SOLN: 5.0000 mL | Freq: Four times a day (QID) | ORAL | 0 refills | Status: DC | PRN

## 2023-01-11 MED ORDER — AMOXICILLIN-POT CLAVULANATE 875-125 MG PO TABS: 1.0000 | ORAL_TABLET | Freq: Two times a day (BID) | ORAL | 0 refills | Status: AC

## 2023-01-11 MED ORDER — FEXOFENADINE HCL 180 MG PO TABS: 180.0000 mg | ORAL_TABLET | Freq: Every day | ORAL | 0 refills | Status: DC

## 2023-01-11 MED ORDER — PREDNISONE 10 MG (21) PO TBPK: ORAL_TABLET | Freq: Every day | ORAL | 0 refills | Status: DC

## 2023-01-11 NOTE — ED Provider Notes (Signed)
[facility]    CSN: [id number] Arrival date & time: 01/11/23  1409      History   Chief Complaint Chief Complaint  Patient presents with   Cough    HPI Zachary Fletcher is a 32 y.o. male.   HPI 32 year old old male presents with cough and runny nose PMH significant for morbid obesity, acute on chronic diastolic CHF, and HTN.  Patient is accompanied by his wife this afternoon.  Past Medical History:  Diagnosis Date   Sleep apnea, obstructive    Tachycardia     Patient Active Problem List   Diagnosis Date Noted   SVT (supraventricular tachycardia) (HCC)    Elevated troponin    Acute on chronic diastolic CHF (congestive heart failure) (HCC)    NSVT (nonsustained ventricular tachycardia) (HCC)    CHF (congestive heart failure) (HCC) 08/20/2021   Arthropathy, multiple sites 06/25/2021   Essential hypertension 06/25/2021   Neuroma of foot 06/25/2021   Morbid obesity (HCC) 11/19/2020   Neuropathy 11/19/2020   OSA on CPAP 11/19/2020   Left groin pain 11/19/2020   Prediabetes 11/19/2020    Past Surgical History:  Procedure Laterality Date   No prior surgery         Home Medications    Prior to Admission medications   Medication Sig Start Date End Date Taking? Authorizing Provider  amoxicillin-clavulanate (AUGMENTIN) 875-125 MG tablet Take 1 tablet by mouth 2 (two) times daily for 10 days. 01/11/23 01/21/23 Yes [name], FNP  fexofenadine [facility] ALLERGY) 180 MG tablet Take 1 tablet (180 mg total) by mouth daily for 15 days. 01/11/23 01/26/23 Yes [name], FNP  HYDROcodone bit-homatropine (HYCODAN) 5-1.5 MG/5ML syrup Take 5 mLs by mouth every 6 (six) hours as needed for cough. 01/11/23  Yes [name], FNP  predniSONE (STERAPRED UNI-PAK 21 TAB) 10 MG (21) TBPK tablet Take by mouth daily. Take 6 tabs by mouth daily  for 2 days, then 5 tabs for 2 days, then 4 tabs for 2 days, then 3 tabs for 2 days, 2 tabs for 2 days, then 1 tab by mouth daily  for 2 days 01/11/23  Yes [name], FNP  dapagliflozin propanediol (FARXIGA) 10 MG TABS tablet Take 1 tablet (10 mg total) by mouth daily. 12/29/22   [name], MD  KLOR-CON M20 20 MEQ tablet TAKE 1 TABLET BY MOUTH EVERY DAY 10/30/22   [name], MD  metoprolol succinate (TOPROL-XL) 25 MG 24 hr tablet TAKE 1 TABLET EVERY MORNING AND 1/2 TABLET AT BEDTIME 12/19/22   [name], MD  torsemide (DEMADEX) 20 MG tablet TAKE 1 TABLET BY MOUTH TWICE A DAY 10/30/22   [name], MD    Family History Family History  Problem Relation Age of Onset   Sleep apnea Father    Diabetes Other     Social History Social History   Tobacco Use   Smoking status: Never   Smokeless tobacco: Never  Vaping Use   Vaping status: Never Used  Substance Use Topics   Alcohol use: Yes    Comment: Rare   Drug use: Never     Allergies   Patient has no known allergies.   Review of Systems Review of Systems  Constitutional:  Positive for fatigue.  HENT:  Positive for congestion.   Respiratory:  Positive for cough.   All other systems reviewed and are negative.    Physical Exam Triage Vital Signs ED Triage Vitals  Encounter Vitals Group  BP      Systolic BP Percentile      Diastolic BP Percentile      Pulse      Resp      Temp      Temp src      SpO2      Weight      Height      Head Circumference      Peak Flow      Pain Score      Pain Loc      Pain Education      Exclude from Growth Chart    No data found.  Updated Vital Signs BP (!) 140/83 (BP Location: Right Arm)   Pulse 85   Temp 98.1 F (36.7 C) (Oral)   Resp 18   SpO2 97%    Physical Exam Vitals and nursing note reviewed.  Constitutional:      Appearance: Normal appearance. He is obese. He is ill-appearing.  HENT:     Head: Normocephalic and atraumatic.     Right Ear: Tympanic membrane and external ear normal.     Left Ear: Tympanic membrane and external ear normal.     Ears:      Comments: Significant eustachian tube dysfunction noted bilaterally    Mouth/Throat:     Mouth: Mucous membranes are moist.     Pharynx: Oropharynx is clear.  Eyes:     Extraocular Movements: Extraocular movements intact.     Conjunctiva/sclera: Conjunctivae normal.     Pupils: Pupils are equal, round, and reactive to light.  Cardiovascular:     Rate and Rhythm: Normal rate and regular rhythm.     Pulses: Normal pulses.     Heart sounds: Normal heart sounds.  Pulmonary:     Effort: Pulmonary effort is normal.     Breath sounds: Normal breath sounds. No wheezing, rhonchi or rales.  Musculoskeletal:        General: Normal range of motion.     Cervical back: Normal range of motion and neck supple.  Skin:    General: Skin is warm and dry.  Neurological:     General: No focal deficit present.     Mental Status: He is alert and oriented to person, place, and time. Mental status is at baseline.  Psychiatric:        Mood and Affect: Mood normal.        Behavior: Behavior normal.        Thought Content: Thought content normal.      UC Treatments / Results  Labs (all labs ordered are listed, but only abnormal results are displayed) Labs Reviewed - No data to display  EKG   Radiology No results found.  Procedures Procedures (including critical care time)  Medications Ordered in UC Medications - No data to display  Initial Impression / Assessment and Plan / UC Course  I have reviewed the triage vital signs and the nursing notes.  Pertinent labs & imaging results that were available during my care of the patient were reviewed by me and considered in my medical decision making (see chart for details).     MDM: 1.  Sinobronchitis-Rx'd Augmentin 875/125 mg tablet: Take 1 tablet twice daily x 10 days, Rx'd Sterapred Unipak (tapering from 60 mg to 10 mg over 10 days); 2.  Allergic rhinitis, unspecified seasonality, unspecified trigger-Rx'd Allegra 180 mg fexofenadine daily x 3 to  5 days; 3.  Cough, unspecified type-CXR results revealed above, Rx'd Hycodan  5-1.5 mg / 5 mL syrup: Take 5 mL by mouth every 6 hours as needed for cough Final Clinical Impressions(s) / UC Diagnoses   Final diagnoses:  Cough, unspecified type  Sinobronchitis  Allergic rhinitis, unspecified seasonality, unspecified trigger     Discharge Instructions      Advised patient to take medication as directed with food to completion.  Advised patient to take prednisone and Allegra with first dose of Augmentin for the next 10 days.  Advised may discontinue Allegra after 3 to 5 days and use as needed for concurrent postnasal drainage/drip.  Advised may use Hycodan at night prior to sleep due to sedate of effects.  Encouraged to increase daily water intake to 64 ounces per day while taking these medications.  Advised we will follow-up with chest x-ray results once received.     ED Prescriptions     Medication Sig Dispense Auth. Provider   HYDROcodone bit-homatropine (HYCODAN) 5-1.5 MG/5ML syrup Take 5 mLs by mouth every 6 (six) hours as needed for cough. 120 mL [name], FNP   amoxicillin-clavulanate (AUGMENTIN) 875-125 MG tablet Take 1 tablet by mouth 2 (two) times daily for 10 days. 20 tablet [name], FNP   predniSONE (STERAPRED UNI-PAK 21 TAB) 10 MG (21) TBPK tablet Take by mouth daily. Take 6 tabs by mouth daily  for 2 days, then 5 tabs for 2 days, then 4 tabs for 2 days, then 3 tabs for 2 days, 2 tabs for 2 days, then 1 tab by mouth daily for 2 days 42 tablet [name], FNP   fexofenadine [facility] ALLERGY) 180 MG tablet Take 1 tablet (180 mg total) by mouth daily for 15 days. 15 tablet [name], FNP      I have reviewed the PDMP during this encounter.   [name], FNP 01/11/23 1555

## 2023-01-11 NOTE — ED Triage Notes (Signed)
Cough and running nose for the past few days, denies fever or chills.

## 2023-01-11 NOTE — Discharge Instructions (Addendum)
Advised patient to take medication as directed with food to completion.  Advised patient to take prednisone and Allegra with first dose of Augmentin for the next 10 days.  Advised may discontinue Allegra after 3 to 5 days and use as needed for concurrent postnasal drainage/drip.  Advised may use Hycodan at night prior to sleep due to sedate of effects.  Encouraged to increase daily water intake to 64 ounces per day while taking these medications.  Advised we will follow-up with chest x-ray results once received.

## 2023-01-12 ENCOUNTER — Telehealth: Payer: Self-pay

## 2023-01-12 NOTE — Telephone Encounter (Signed)
Called cxr results to patient

## 2023-02-03 ENCOUNTER — Encounter: Payer: Self-pay | Admitting: Nurse Practitioner

## 2023-02-03 ENCOUNTER — Ambulatory Visit: Payer: 59 | Admitting: Nurse Practitioner

## 2023-02-03 VITALS — BP 130/74 | HR 80 | Temp 97.2°F | Resp 18 | Ht 75.0 in | Wt >= 6400 oz

## 2023-02-03 DIAGNOSIS — R7303 Prediabetes: Secondary | ICD-10-CM

## 2023-02-03 DIAGNOSIS — E559 Vitamin D deficiency, unspecified: Secondary | ICD-10-CM

## 2023-02-03 DIAGNOSIS — Z23 Encounter for immunization: Secondary | ICD-10-CM

## 2023-02-03 DIAGNOSIS — Z0001 Encounter for general adult medical examination with abnormal findings: Secondary | ICD-10-CM

## 2023-02-03 DIAGNOSIS — Z13220 Encounter for screening for lipoid disorders: Secondary | ICD-10-CM

## 2023-02-03 DIAGNOSIS — I471 Supraventricular tachycardia, unspecified: Secondary | ICD-10-CM | POA: Diagnosis not present

## 2023-02-03 DIAGNOSIS — Z136 Encounter for screening for cardiovascular disorders: Secondary | ICD-10-CM

## 2023-02-03 DIAGNOSIS — Z0000 Encounter for general adult medical examination without abnormal findings: Secondary | ICD-10-CM

## 2023-02-03 DIAGNOSIS — G629 Polyneuropathy, unspecified: Secondary | ICD-10-CM

## 2023-02-03 DIAGNOSIS — I83893 Varicose veins of bilateral lower extremities with other complications: Secondary | ICD-10-CM

## 2023-02-03 LAB — COMPREHENSIVE METABOLIC PANEL
ALT: 17 U/L (ref 0–53)
AST: 17 U/L (ref 0–37)
Albumin: 3.9 g/dL (ref 3.5–5.2)
Alkaline Phosphatase: 69 U/L (ref 39–117)
BUN: 26 mg/dL — ABNORMAL HIGH (ref 6–23)
CO2: 28 meq/L (ref 19–32)
Calcium: 9.3 mg/dL (ref 8.4–10.5)
Chloride: 102 meq/L (ref 96–112)
Creatinine, Ser: 1.04 mg/dL (ref 0.40–1.50)
GFR: 94.94 mL/min (ref >60.00)
Glucose, Bld: 112 mg/dL — ABNORMAL HIGH (ref 70–99)
Potassium: 3.6 meq/L (ref 3.5–5.1)
Sodium: 138 meq/L (ref 135–145)
Total Bilirubin: 1 mg/dL (ref 0.2–1.2)
Total Protein: 6.9 g/dL (ref 6.0–8.3)

## 2023-02-03 LAB — LIPID PANEL
Cholesterol: 139 mg/dL (ref 0–200)
HDL: 37.6 mg/dL — ABNORMAL LOW (ref >39.00)
LDL Cholesterol: 85 mg/dL (ref 0–99)
NonHDL: 101.06
Total CHOL/HDL Ratio: 4
Triglycerides: 79 mg/dL (ref 0.0–149.0)
VLDL: 15.8 mg/dL (ref 0.0–40.0)

## 2023-02-03 LAB — CBC
HCT: 48.8 % (ref 39.0–52.0)
Hemoglobin: 16.3 g/dL (ref 13.0–17.0)
MCHC: 33.4 g/dL (ref 30.0–36.0)
MCV: 89.5 fL (ref 78.0–100.0)
Platelets: 149 10*3/uL — ABNORMAL LOW (ref 150.0–400.0)
RBC: 5.46 Mil/uL (ref 4.22–5.81)
RDW: 14.6 % (ref 11.5–15.5)
WBC: 7.4 10*3/uL (ref 4.0–10.5)

## 2023-02-03 LAB — VITAMIN D 25 HYDROXY (VIT D DEFICIENCY, FRACTURES): VITD: 11.32 ng/mL — ABNORMAL LOW (ref 30.00–100.00)

## 2023-02-03 LAB — TSH: TSH: 1.53 u[IU]/mL (ref 0.35–5.50)

## 2023-02-03 LAB — HEMOGLOBIN A1C: Hgb A1c MFr Bld: 6.1 % (ref 4.6–6.5)

## 2023-02-03 NOTE — Assessment & Plan Note (Signed)
Repeat hgbA1c 

## 2023-02-03 NOTE — Assessment & Plan Note (Addendum)
Chronic, stable, worse with prolonged standing or walking. 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

## 2023-02-03 NOTE — Patient Instructions (Signed)
Go to lab Maintain mediterranean diet and daily exercise. Maintain current medications.  How to Increase Your Level of Physical Activity Getting regular physical activity is important for your overall health and well-being. Most people do not get enough exercise. There are easy ways to increase your level of physical activity, even if you have not been very active in the past or if you are just starting out. What are the benefits of physical activity? Physical activity has many short-term and long-term benefits. Being active on a regular basis can improve your physical and mental health as well as provide other benefits. Physical health benefits Helping you lose weight or maintain a healthy weight. Strengthening your muscles and bones. Reducing your risk of certain long-term (chronic) diseases, including heart disease, cancer, and diabetes. Being able to move around more easily and for longer periods of time without getting tired (increased endurance or stamina). Improving your ability to fight off illness (enhanced immunity). Being able to sleep better. Helping you stay healthy as you get older, including: Helping you stay mobile, or capable of walking and moving around. Preventing accidents, such as falls. Increasing life expectancy. Mental health benefits Boosting your mood and improving your self-esteem. Lowering your chance of having mental health problems, such as depression or anxiety. Helping you feel good about your body. Other benefits Finding new sources of fun and enjoyment. Meeting new people who share a common interest. Before you begin If you have a chronic illness or have not been active for a while, check with your health care provider about how to get started. Ask your health care provider what activities are safe for you. Start out slowly. Walking or doing some simple chair exercises is a good place to start, especially if you have not been active before or for a long  time. Set goals that you can work toward. Ask your health care provider how much exercise is best for you. In general, most adults should: Do moderate-intensity exercise for at least 150 minutes each week (30 minutes on most days of the week) or vigorous exercise for at least 75 minutes each week, or a combination of these. Moderate-intensity exercise can include walking at a quick pace, biking, yoga, water aerobics, or gardening. Vigorous exercise involves activities that take more effort, such as jogging or running, playing sports, swimming laps, or jumping rope. Do strength exercises on at least 2 days each week. This can include weight lifting, body weight exercises, and resistance-band exercises. How to be more physically active Make a plan  Try to find activities that you enjoy. You are more likely to commit to an exercise routine if it does not feel like a chore. If you have bone or joint problems, choose low-impact exercises, like walking or swimming. Use these tips for being successful with an exercise plan: Find a workout partner for accountability. Join a group or class, such as an aerobics class, cycling class, or sports team. Make family time active. Go for a walk, bike, or swim. Include a variety of exercises each week. Consider using a fitness tracker, such as a mobile phone app or a device worn like a watch, that will count the number of steps you take each day. Many people strive to reach 10,000 steps a day. Find ways to be active in your daily routines Besides your formal exercise plans, you can find ways to do physical activity during your daily routines, such as: Walking or biking to work or to the store. Taking the  stairs instead of the elevator. Parking farther away from the door at work or at the store. Planning walking meetings. Walking around while you are on the phone. Where to find more information Centers for Disease Control and Prevention:  [employer] on Fitness, Sports & Nutrition: www.fitness.gov ChooseMyPlate: http://www.harvey.com/ Contact a health care provider if: You have headaches, muscle aches, or joint pain that is concerning. You feel dizzy or light-headed while exercising. You faint. You feel your heart skipping, racing, or fluttering. You have chest pain while exercising. Summary Exercise benefits your mind and body at any age, even if you are just starting out. If you have a chronic illness or have not been active for a while, check with your health care provider before increasing your physical activity. Choose activities that are safe and enjoyable for you. Ask your health care provider what activities are safe for you. Start slowly. Tell your health care provider if you have problems as you start to increase your activity level. This information is not intended to replace advice given to you by your health care provider. Make sure you discuss any questions you have with your health care provider. Document Revised: 06/22/2020 Document Reviewed: 06/22/2020 Elsevier Patient Education  2024 [employer].

## 2023-02-03 NOTE — Progress Notes (Signed)
Complete physical exam  Patient: Zachary Fletcher   DOB: [birth year]   32 y.o. Male  MRN: [id number] Visit Date: 02/03/2023  Subjective:    Chief Complaint  Patient presents with   Annual Exam    PT is here for annual exam     Toderick Ziman is a 32 y.o. male who presents today for a complete physical exam. He reports consuming a  high protein  diet.  No exercise regimen  He generally feels well. He reports sleeping well. He does have additional problems to discuss today.  Vision:No Dental:Yes STD Screen:No PSA:No BP Readings from Last 3 Encounters:  02/03/23 130/74  01/11/23 (!) 140/83  06/03/22 131/79   Wt Readings from Last 3 Encounters:  02/03/23 (!) 436 lb 6.4 oz (197.9 kg)  04/16/22 (!) 427 lb (193.7 kg)  11/06/21 (!) 431 lb 12.8 oz (195.9 kg)   Bloating with wheat products  Most recent fall risk assessment:    02/03/2023    9:11 AM  Fall Risk   Falls in the past year? 0  Number falls in past yr: 0  Injury with Fall? 0  Risk for fall due to : No Fall Risks  Follow up Falls evaluation completed     Depression screen:Yes - No Depression Most recent depression screenings:    02/03/2023    9:15 AM 11/19/2020   10:15 AM  PHQ 2/9 Scores  PHQ - 2 Score 2 5  PHQ- 9 Score 6 17    HPI  Neuropathy Chronic, stable, worse with prolonged standing or walking. 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  Varicose veins of both legs with edema No sign of cellulitis Encouraged to maintain DASH diet, daily exercise and use of compression stockings  SVT (supraventricular tachycardia) (HCC) No syncope, no palpitation Current use of metoprolol  Morbid obesity due to excess calories (HCC) Agreed to weight management clinic referral. We discussed ways to increase daily exercise  (provided printed information) and need for DASH diet. Wt Readings from Last 3 Encounters:  02/03/23 (!) 436 lb 6.4 oz (197.9 kg)  04/16/22 (!) 427 lb (193.7 kg)  11/06/21 (!) 431 lb 12.8 oz (195.9 kg)     Prediabetes Repeat hgbA1c   Past Medical History:  Diagnosis Date   Acute on chronic diastolic CHF (congestive heart failure) (HCC)    NSVT (nonsustained ventricular tachycardia) (HCC)    Sleep apnea, obstructive    Tachycardia    Past Surgical History:  Procedure Laterality Date   No prior surgery     Social History   Socioeconomic History   Marital status: Married    Spouse name: Izear Iffland   Number of children: 0   Years of education: Not on file   Highest education level: Associate degree: occupational, Scientist, product/process development, or vocational program  Occupational History   Occupation: [employer]    Comment: Management consultant company  Tobacco Use   Smoking status: Never   Smokeless tobacco: Never  Vaping Use   Vaping status: Never Used  Substance and Sexual Activity   Alcohol use: Yes    Comment: Rare   Drug use: Never   Sexual activity: Not on file  Other Topics Concern   Not on file  Social History Narrative   Right handed    Live with spouse    Social  Determinants of Health   Financial Resource Strain: Low Risk  (08/22/2021)   Overall Financial Resource Strain (CARDIA)    Difficulty of Paying Living Expenses: Not hard at all  Food Insecurity: No Food Insecurity (08/22/2021)   Hunger Vital Sign    Worried About Running Out of Food in the Last Year: Never true    Ran Out of Food in the Last Year: Never true  Transportation Needs: No Transportation Needs (08/22/2021)   PRAPARE - Administrator, Civil Service (Medical): No    Lack of Transportation (Non-Medical): No  Physical Activity: Not on file  Stress: Not on file  Social Connections: Unknown (07/22/2021)   Received from [facility], [facility]   Social Network    Social Network: Not on file  Intimate  Partner Violence: Unknown (06/13/2021)   Received from [facility], [facility]   HITS    Physically Hurt: Not on file    Insult or Talk Down To: Not on file    Threaten Physical Harm: Not on file    Scream or Curse: Not on file   Family Status  Relation Name Status   Father  Alive   Other  (Not Specified)   Mother  Alive  No partnership data on file   Family History  Problem Relation Age of Onset   Sleep apnea Father    Diabetes Other    No Known Allergies  Patient Care Team: Nolie Bignell, Bonna Gains, NP as PCP - General (Internal Medicine) Jens Som Madolyn Frieze, MD as PCP - Cardiology (Cardiology) Lennette Bihari, MD as PCP - Sleep Medicine (Cardiology) Jens Som Madolyn Frieze, MD as Consulting Physician (Cardiology) Glendale Chard, DO as Consulting Physician (Neurology)   Medications: Outpatient Medications Prior to Visit  Medication Sig   cetirizine-pseudoephedrine (ZYRTEC-D) 5-120 MG tablet Take by mouth.   dapagliflozin propanediol (FARXIGA) 10 MG TABS tablet Take 1 tablet (10 mg total) by mouth daily.   KLOR-CON M20 20 MEQ tablet TAKE 1 TABLET BY MOUTH EVERY DAY   metoprolol succinate (TOPROL-XL) 25 MG 24 hr tablet TAKE 1 TABLET EVERY MORNING AND 1/2 TABLET AT BEDTIME   sodium chloride (OCEAN) 0.65 % nasal spray Place into the nose.   torsemide (DEMADEX) 20 MG tablet TAKE 1 TABLET BY MOUTH TWICE A DAY   fexofenadine (ALLEGRA ALLERGY) 180 MG tablet Take 1 tablet (180 mg total) by mouth daily for 15 days.   HYDROcodone bit-homatropine (HYCODAN) 5-1.5 MG/5ML syrup Take 5 mLs by mouth every 6 (six) hours as needed for cough. (Patient not taking: Reported on 02/03/2023)   predniSONE (STERAPRED UNI-PAK 21 TAB) 10 MG (21) TBPK tablet Take by mouth daily. Take 6 tabs by mouth daily  for 2 days, then 5 tabs for 2 days, then 4 tabs for 2 days, then 3 tabs for 2 days, 2 tabs for 2 days, then 1 tab by mouth daily for 2 days (Patient not taking: Reported on 02/03/2023)   No  facility-administered medications prior to visit.    Review of Systems  Constitutional:  Negative for activity change, appetite change and unexpected weight change.  Respiratory: Negative.    Cardiovascular: Negative.   Gastrointestinal: Negative.   Endocrine: Negative for cold intolerance and heat intolerance.  Genitourinary: Negative.   Musculoskeletal: Negative.   Skin: Negative.   Neurological: Negative.   Hematological: Negative.   Psychiatric/Behavioral:  Negative for behavioral problems, decreased concentration, dysphoric mood, hallucinations, self-injury, sleep disturbance and suicidal ideas. The patient is not nervous/anxious.  Last CBC Lab Results  Component Value Date   WBC 11.1 (H) 08/23/2021   HGB 18.1 (H) 08/23/2021   HCT 52.5 (H) 08/23/2021   MCV 86.5 08/23/2021   MCH 29.8 08/23/2021   RDW 13.1 08/23/2021   PLT 211 08/23/2021   Last metabolic panel Lab Results  Component Value Date   GLUCOSE 116 (H) 04/16/2022   NA 140 04/16/2022   K 4.3 04/16/2022   CL 101 04/16/2022   CO2 25 04/16/2022   BUN 16 04/16/2022   CREATININE 0.97 04/16/2022   EGFR 107 04/16/2022   CALCIUM 9.2 04/16/2022   PROT 6.7 08/20/2021   ALBUMIN 3.8 08/20/2021   BILITOT 1.0 08/20/2021   ALKPHOS 66 08/20/2021   AST 16 08/20/2021   ALT 17 08/20/2021   ANIONGAP 9 09/06/2021   Last lipids Lab Results  Component Value Date   CHOL 138 08/22/2021   HDL 34 (L) 08/22/2021   LDLCALC 85 08/22/2021   LDLDIRECT 82.0 11/19/2020   TRIG 94 08/22/2021   CHOLHDL 4.1 08/22/2021   Last hemoglobin A1c Lab Results  Component Value Date   HGBA1C 5.9 (H) 08/22/2021   Last thyroid functions Lab Results  Component Value Date   TSH 1.95 11/19/2020       Objective:  BP 130/74 (BP Location: Left Arm, Patient Position: Sitting, Cuff Size: Large)   Pulse 80   Temp (!) 97.2 F (36.2 C) (Temporal)   Resp 18   Ht 6\' 3"  (1.905 m)   Wt (!) 436 lb 6.4 oz (197.9 kg)   SpO2 98%   BMI 54.55  kg/m     Physical Exam Vitals and nursing note reviewed.  Constitutional:      General: He is not in acute distress. HENT:     Right Ear: Tympanic membrane, ear canal and external ear normal.     Left Ear: Tympanic membrane, ear canal and external ear normal.     Nose: Nose normal.  Eyes:     Extraocular Movements: Extraocular movements intact.     Conjunctiva/sclera: Conjunctivae normal.     Pupils: Pupils are equal, round, and reactive to light.  Neck:     Thyroid: No thyroid mass, thyromegaly or thyroid tenderness.  Cardiovascular:     Rate and Rhythm: Normal rate and regular rhythm.     Pulses: Normal pulses.     Heart sounds: Normal heart sounds.  Pulmonary:     Effort: Pulmonary effort is normal.     Breath sounds: Normal breath sounds.  Abdominal:     General: Bowel sounds are normal.     Palpations: Abdomen is soft.  Musculoskeletal:        General: Normal range of motion.     Cervical back: Normal range of motion and neck supple.     Right lower leg: Edema present.     Left lower leg: Edema present.  Lymphadenopathy:     Cervical: No cervical adenopathy.  Skin:    General: Skin is warm and dry.     Findings: No erythema.  Neurological:     Mental Status: He is alert and oriented to person, place, and time.     Cranial Nerves: No cranial nerve deficit.  Psychiatric:        Mood and Affect: Mood normal.        Behavior: Behavior normal.        Thought Content: Thought content normal.      No results found for any visits on  02/03/23.    Assessment & Plan:    Routine Health Maintenance and Physical Exam  Immunization History  Administered Date(s) Administered   Tdap 02/03/2023   Health Maintenance  Topic Date Due   COVID-19 Vaccine (1 - 2023-24 season) 02/19/2023 (Originally 11/09/2022)   INFLUENZA VACCINE  06/08/2023 (Originally 10/09/2022)   Hepatitis C Screening  02/03/2024 (Originally 06/23/2008)   DTaP/Tdap/Td (2 - Td or Tdap) 02/02/2033   HIV  Screening  Completed   HPV VACCINES  Aged Out   Discussed health benefits of physical activity, and encouraged him to engage in regular exercise appropriate for his age and condition.  Problem List Items Addressed This Visit     Morbid obesity due to excess calories (HCC)    Agreed to weight management clinic referral. We discussed ways to increase daily exercise (provided printed information) and need for DASH diet. Wt Readings from Last 3 Encounters:  02/03/23 (!) 436 lb 6.4 oz (197.9 kg)  04/16/22 (!) 427 lb (193.7 kg)  11/06/21 (!) 431 lb 12.8 oz (195.9 kg)         Neuropathy    Chronic, stable, worse with prolonged standing or walking. 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      Prediabetes    Repeat hgbA1c      Relevant Orders   Amb Ref to Medical Weight Management   Hemoglobin A1c   SVT (supraventricular tachycardia) (HCC)    No syncope, no palpitation Current use of metoprolol      Varicose veins of both legs with edema    No sign of cellulitis Encouraged to maintain DASH diet, daily exercise and use of compression stockings      Other Visit Diagnoses     Encounter for preventative adult health care exam with abnormal findings    -  Primary   Relevant Orders   Comprehensive metabolic panel   TSH   CBC   Immunization due       Relevant Orders   Tdap vaccine greater than or equal to 7yo IM (Completed)   Vitamin D insufficiency       Relevant Orders   VITAMIN D 25 Hydroxy (Vit-D Deficiency, Fractures)   Encounter for lipid screening for cardiovascular disease       Relevant Orders   Lipid panel      Return in about 2 weeks (around 02/17/2023) for neuropathy.     Alysia Penna, NP

## 2023-02-03 NOTE — Assessment & Plan Note (Addendum)
No sign of cellulitis Encouraged to maintain DASH diet, daily exercise and use of compression stockings

## 2023-02-03 NOTE — Assessment & Plan Note (Signed)
No syncope, no palpitation Current use of metoprolol

## 2023-02-03 NOTE — Assessment & Plan Note (Signed)
Agreed to weight management clinic referral. We discussed ways to increase daily exercise (provided printed information) and need for DASH diet. Wt Readings from Last 3 Encounters:  02/03/23 (!) 436 lb 6.4 oz (197.9 kg)  04/16/22 (!) 427 lb (193.7 kg)  11/06/21 (!) 431 lb 12.8 oz (195.9 kg)

## 2023-02-09 ENCOUNTER — Other Ambulatory Visit: Payer: Self-pay | Admitting: Nurse Practitioner

## 2023-02-09 MED ORDER — VITAMIN D (ERGOCALCIFEROL) 1.25 MG (50000 UNIT) PO CAPS: 50000.0000 [IU] | ORAL_CAPSULE | ORAL | 0 refills | Status: AC

## 2023-02-09 NOTE — Addendum Note (Signed)
Addended by: Alysia Penna L on: 02/09/2023 01:02 PM   Modules accepted: Orders

## 2023-04-07 ENCOUNTER — Ambulatory Visit
Admission: EM | Admit: 2023-04-07 | Discharge: 2023-04-07 | Disposition: A | Discharge status: Home / Self Care | Payer: 59 | Attending: Family Medicine | Admitting: Family Medicine

## 2023-04-07 ENCOUNTER — Ambulatory Visit (HOSPITAL_BASED_OUTPATIENT_CLINIC_OR_DEPARTMENT_OTHER)
Admission: RE | Admit: 2023-04-07 | Discharge: 2023-04-07 | Disposition: A | Discharge status: Home / Self Care | Payer: 59 | Source: Ambulatory Visit | Attending: Urgent Care

## 2023-04-07 DIAGNOSIS — I509 Heart failure, unspecified: Secondary | ICD-10-CM | POA: Diagnosis not present

## 2023-04-07 DIAGNOSIS — J209 Acute bronchitis, unspecified: Secondary | ICD-10-CM | POA: Insufficient documentation

## 2023-04-07 MED ORDER — PROMETHAZINE-DM 6.25-15 MG/5ML PO SYRP: 5.0000 mL | ORAL_SOLUTION | Freq: Three times a day (TID) | ORAL | 0 refills | Status: DC | PRN

## 2023-04-07 MED ORDER — PREDNISONE 20 MG PO TABS: ORAL_TABLET | ORAL | 0 refills | Status: DC

## 2023-04-07 NOTE — ED Provider Notes (Signed)
[name]  Note:  This document was prepared using Conservation officer, historic buildings and may include unintentional dictation errors.  MRN: [id number] DOB: May 14, 1990  Subjective:   Zachary Fletcher is a 33 y.o. male presenting for 3-day history of recurrent wheezing, coughing, chest congestion.  Has concerns that he has recurrent bronchitis.  Was previously seen for this throat clinic and responded well with prednisone.  Has a history of congestive heart failure, takes torsemide.  EF was 60-65% from echocardiogram 08/21/2021.  Deferred imaging given clear cardiopulmonary exam, hemodynamically stable vital signs.   No current facility-administered medications for this encounter.  Current Outpatient Medications:    dapagliflozin propanediol (FARXIGA) 10 MG TABS tablet, Take 1 tablet (10 mg total) by mouth daily., Disp: 90 tablet, Rfl: 3   fexofenadine (ALLEGRA ALLERGY) 180 MG tablet, Take 1 tablet (180 mg total) by mouth daily for 15 days., Disp: 15 tablet, Rfl: 0   KLOR-CON M20 20 MEQ tablet, TAKE 1 TABLET BY MOUTH EVERY DAY, Disp: 90 tablet, Rfl: 3   metoprolol succinate (TOPROL-XL) 25 MG 24 hr tablet, TAKE 1 TABLET EVERY MORNING AND 1/2 TABLET AT BEDTIME, Disp: 45 tablet, Rfl: 4   sodium chloride (OCEAN) 0.65 % nasal spray, Place into the nose., Disp: , Rfl:    torsemide (DEMADEX) 20 MG tablet, TAKE 1 TABLET BY MOUTH TWICE A DAY, Disp: 180 tablet, Rfl: 3   Vitamin D, Ergocalciferol, (DRISDOL) 1.25 MG (50000 UNIT) CAPS capsule, Take 1 capsule (50,000 Units total) by mouth every 7 (seven) days., Disp: 12 capsule, Rfl: 0   No Known Allergies  Past Medical History:  Diagnosis Date   Acute on chronic diastolic CHF (congestive heart failure) (HCC)    Left groin pain 11/19/2020   NSVT (nonsustained ventricular tachycardia) (HCC)    Sleep apnea, obstructive    Tachycardia      Past Surgical History:  Procedure Laterality Date   No prior surgery      Family  History  Problem Relation Age of Onset   Sleep apnea Father    Diabetes Other     Social History   Tobacco Use   Smoking status: Never   Smokeless tobacco: Never  Vaping Use   Vaping status: Never Used  Substance Use Topics   Alcohol use: Yes    Comment: Rare   Drug use: Never    ROS   Objective:   Vitals: BP (!) 148/79 (BP Location: Right Arm)   Pulse 96   Temp 99.6 F (37.6 C) (Oral)   Resp 20   SpO2 97%   Physical Exam Constitutional:      General: He is not in acute distress.    Appearance: Normal appearance. He is well-developed. He is obese. He is not ill-appearing, toxic-appearing or diaphoretic.  HENT:     Head: Normocephalic and atraumatic.     Right Ear: External ear normal.     Left Ear: External ear normal.     Nose: Nose normal.     Mouth/Throat:     Mouth: Mucous membranes are moist.  Eyes:     General: No scleral icterus.       Right eye: No discharge.        Left eye: No discharge.     Extraocular Movements: Extraocular movements intact.  Cardiovascular:     Rate and Rhythm: Normal rate and regular rhythm.     Heart sounds: Normal heart sounds. No murmur heard.    No friction  rub. No gallop.  Pulmonary:     Effort: Pulmonary effort is normal. No respiratory distress.     Breath sounds: No stridor. Rhonchi (mild over mid-lower lung fields bilaterally) present. No wheezing or rales.  Neurological:     Mental Status: He is alert and oriented to person, place, and time.  Psychiatric:        Mood and Affect: Mood normal.        Behavior: Behavior normal.        Thought Content: Thought content normal.     DG Chest 2 View Result Date: 04/07/2023 CLINICAL DATA:  Cough and shortness of breath. EXAM: CHEST - 2 VIEW COMPARISON:  Chest radiograph dated 01/11/2023. FINDINGS: No focal consolidation, pleural effusion, or pneumothorax. Stable mild cardiomegaly. No acute osseous pathology. IMPRESSION: 1. No active cardiopulmonary disease. 2. Mild  cardiomegaly. Electronically Signed   By: Elgie Collard M.D.   On: 04/07/2023 13:21     Assessment and Plan :   PDMP not reviewed this encounter.  1. Acute bronchitis, unspecified organism   2. Chronic congestive heart failure, unspecified heart failure type (HCC)    Will manage bronchitis with prednisone, general supportive care.  Chest x-ray negative, will defer antibiotic use.  Patient declined COVID testing.  Counseled patient on potential for adverse effects with medications prescribed/recommended today, ER and return-to-clinic precautions discussed, patient verbalized understanding.    Wallis Bamberg, [place] 04/07/23 [id number]

## 2023-04-07 NOTE — ED Triage Notes (Signed)
Pt reports cough and wheezing x 2-3 days. Pt has not taken any meds for complaints.

## 2023-04-07 NOTE — Discharge Instructions (Addendum)
I have placed orders to have an x-ray done at the med center in [facility].  Please had there now.  Go through the main hospital and not the emergency room.  Once you are there and let them know that you will came to our clinic and we send she to their facility for an outpatient x-ray.  If no one is at the front desk then they are likely out the rest of the day and at that point you would have to go through the emergency room.  Do not check in as a patient through the emergency room.  Simply let them know that you are there for an outpatient x-ray from our clinic.  I will call you with your results and update our treatment plan if necessary after I get the report.  Please wait to go pick up your prescriptions for any medications I have prescribed for you until after we discussed your x-ray results.   Will finalize treatment plan and diagnoses once I get the x-ray report.

## 2023-04-13 ENCOUNTER — Encounter: Payer: 59 | Admitting: Family Medicine

## 2023-05-30 ENCOUNTER — Encounter: Payer: Self-pay | Admitting: Cardiology

## 2023-06-12 ENCOUNTER — Ambulatory Visit (INDEPENDENT_AMBULATORY_CARE_PROVIDER_SITE_OTHER)

## 2023-06-12 ENCOUNTER — Ambulatory Visit
Admission: EM | Admit: 2023-06-12 | Discharge: 2023-06-12 | Disposition: A | Discharge status: Home / Self Care | Attending: Family Medicine | Admitting: Family Medicine

## 2023-06-12 DIAGNOSIS — J189 Pneumonia, unspecified organism: Secondary | ICD-10-CM | POA: Diagnosis not present

## 2023-06-12 DIAGNOSIS — J4521 Mild intermittent asthma with (acute) exacerbation: Secondary | ICD-10-CM | POA: Diagnosis not present

## 2023-06-12 DIAGNOSIS — R051 Acute cough: Secondary | ICD-10-CM | POA: Diagnosis not present

## 2023-06-12 LAB — POC COVID19/FLU A&B COMBO
Covid Antigen, POC: NEGATIVE
Influenza A Antigen, POC: NEGATIVE
Influenza B Antigen, POC: NEGATIVE

## 2023-06-12 MED ORDER — PROMETHAZINE-DM 6.25-15 MG/5ML PO SYRP: 5.0000 mL | ORAL_SOLUTION | Freq: Four times a day (QID) | ORAL | 0 refills | Status: DC | PRN

## 2023-06-12 MED ORDER — ALBUTEROL SULFATE HFA 108 (90 BASE) MCG/ACT IN AERS: 2.0000 | INHALATION_SPRAY | RESPIRATORY_TRACT | 0 refills | Status: AC | PRN

## 2023-06-12 MED ORDER — LEVOFLOXACIN 750 MG PO TABS: 750.0000 mg | ORAL_TABLET | Freq: Every day | ORAL | 0 refills | Status: DC

## 2023-06-12 MED ORDER — PREDNISONE 20 MG PO TABS: 40.0000 mg | ORAL_TABLET | Freq: Every day | ORAL | 0 refills | Status: AC

## 2023-06-12 MED ORDER — IBUPROFEN 400 MG PO TABS
400.0000 mg | ORAL_TABLET | Freq: Once | ORAL | Status: AC
  Administered 2023-06-12: 400 mg via ORAL

## 2023-06-12 NOTE — Discharge Instructions (Signed)
 On my review there is a pneumonia in your right lower lung.  The radiologist will also read your x-ray, and if their interpretation differs significantly from mine, and the management of your condition would change, we will call you.  Levaquin 750 mg--take 1 tablet daily for 7 days; this antibiotic.  Take prednisone 20 mg--2 daily for 5 days; this is for inflammation in your lungs.  Albuterol inhaler--do 2 puffs every 4 hours as needed for shortness of breath or wheezing  Take Phenergan with dextromethorphan syrup--5 mL or 1 teaspoon every 6 hours as needed for cough

## 2023-06-12 NOTE — ED Provider Notes (Signed)
 [facility]    CSN: [id number] Arrival date & time: 06/12/23  1304      History   Chief Complaint Chief Complaint  Patient presents with   Cough    HPI Zachary Fletcher is a 33 y.o. male.    Cough For cough productive of green sputum, nasal congestion and chills.  Symptoms began on the evening of April 1.  No nausea or vomiting or diarrhea.  He has felt tight in his chest.  He had similar symptoms in January when he had "bronchitis".   NKDA  Past medical history significant for congestive heart failure and hypertension.  Past Medical History:  Diagnosis Date   Acute on chronic diastolic CHF (congestive heart failure) (HCC)    Left groin pain 11/19/2020   NSVT (nonsustained ventricular tachycardia) (HCC)    Sleep apnea, obstructive    Tachycardia     Patient Active Problem List   Diagnosis Date Noted   Varicose veins of both legs with edema 02/03/2023   SVT (supraventricular tachycardia) (HCC)    Elevated troponin    CHF (congestive heart failure) (HCC) 08/20/2021   Arthropathy, multiple sites 06/25/2021   Essential hypertension 06/25/2021   Neuroma of foot 06/25/2021   Neuropathy 11/19/2020   OSA on CPAP 11/19/2020   Prediabetes 11/19/2020   Morbid obesity due to excess calories (HCC) 04/10/2015    Past Surgical History:  Procedure Laterality Date   No prior surgery         Home Medications    Prior to Admission medications   Medication Sig Start Date End Date Taking? Authorizing Provider  albuterol (VENTOLIN HFA) 108 (90 Base) MCG/ACT inhaler Inhale 2 puffs into the lungs every 4 (four) hours as needed for wheezing or shortness of breath. 06/12/23  Yes Zenia Resides, MD  levofloxacin (LEVAQUIN) 750 MG tablet Take 1 tablet (750 mg total) by mouth daily. 06/12/23  Yes Zenia Resides, MD  predniSONE (DELTASONE) 20 MG tablet Take 2 tablets (40 mg total) by mouth daily with breakfast for 5 days. 06/12/23 06/17/23 Yes Zenia Resides, MD   promethazine-dextromethorphan (PROMETHAZINE-DM) 6.25-15 MG/5ML syrup Take 5 mLs by mouth 4 (four) times daily as needed for cough. 06/12/23  Yes Zenia Resides, MD  dapagliflozin propanediol (FARXIGA) 10 MG TABS tablet Take 1 tablet (10 mg total) by mouth daily. 12/29/22   [name], MD  fexofenadine [facility] ALLERGY) 180 MG tablet Take 1 tablet (180 mg total) by mouth daily for 15 days. 01/11/23 01/26/23  [name], FNP  KLOR-CON M20 20 MEQ tablet TAKE 1 TABLET BY MOUTH EVERY DAY 10/30/22   [name], MD  metoprolol succinate (TOPROL-XL) 25 MG 24 hr tablet TAKE 1 TABLET EVERY MORNING AND 1/2 TABLET AT BEDTIME 12/19/22   [name], MD  sodium chloride (OCEAN) 0.65 % nasal spray Place into the nose. 05/06/22 05/06/23  [provider]  torsemide (DEMADEX) 20 MG tablet TAKE 1 TABLET BY MOUTH TWICE A DAY 10/30/22   [name], MD  Vitamin D, Ergocalciferol, (DRISDOL) 1.25 MG (50000 UNIT) CAPS capsule Take 1 capsule (50,000 Units total) by mouth every 7 (seven) days. 02/09/23   Nche, Bonna Gains, NP    Family History Family History  Problem Relation Age of Onset   Sleep apnea Father    Diabetes Other     Social History Social History   Tobacco Use   Smoking status: Never   Smokeless tobacco: Never  Vaping Use  Vaping status: Never Used  Substance Use Topics   Alcohol use: Yes    Comment: Rare   Drug use: Never     Allergies   Patient has no known allergies.   Review of Systems Review of Systems  Respiratory:  Positive for cough.      Physical Exam Triage Vital Signs ED Triage Vitals  Encounter Vitals Group     BP 06/12/23 1318 126/69     Systolic BP Percentile --      Diastolic BP Percentile --      Pulse Rate 06/12/23 1318 80     Resp 06/12/23 1318 (!) 24     Temp 06/12/23 1318 (!) 101.2 F (38.4 C)     Temp Source 06/12/23 1318 Oral     SpO2 06/12/23 1318 96 %     Weight --      Height --      Head Circumference --       Peak Flow --      Pain Score 06/12/23 1319 6     Pain Loc --      Pain Education --      Exclude from Growth Chart --    No data found.  Updated Vital Signs BP 126/69 (BP Location: Right Arm)   Pulse 80   Temp (!) 101.2 F (38.4 C) (Oral)   Resp (!) 24   SpO2 96%   Visual Acuity Right Eye Distance:   Left Eye Distance:   Bilateral Distance:    Right Eye Near:   Left Eye Near:    Bilateral Near:     Physical Exam Vitals reviewed.  Constitutional:      General: He is not in acute distress.    Appearance: He is not ill-appearing, toxic-appearing or diaphoretic.  HENT:     Right Ear: Tympanic membrane and ear canal normal.     Left Ear: Tympanic membrane and ear canal normal.     Nose: Congestion present.     Mouth/Throat:     Mouth: Mucous membranes are moist.     Pharynx: No oropharyngeal exudate or posterior oropharyngeal erythema.  Eyes:     Extraocular Movements: Extraocular movements intact.     Conjunctiva/sclera: Conjunctivae normal.     Pupils: Pupils are equal, round, and reactive to light.  Cardiovascular:     Rate and Rhythm: Normal rate and regular rhythm.     Heart sounds: No murmur heard. Pulmonary:     Effort: No respiratory distress.     Breath sounds: No stridor. No wheezing, rhonchi or rales.  Musculoskeletal:     Cervical back: Neck supple.  Lymphadenopathy:     Cervical: No cervical adenopathy.  Skin:    Capillary Refill: Capillary refill takes less than 2 seconds.     Coloration: Skin is not jaundiced or pale.  Neurological:     General: No focal deficit present.     Mental Status: He is alert and oriented to person, place, and time.  Psychiatric:        Behavior: Behavior normal.      UC Treatments / Results  Labs (all labs ordered are listed, but only abnormal results are displayed) Labs Reviewed  POC COVID19/FLU A&B COMBO    EKG   Radiology No results found.  Procedures Procedures (including critical care  time)  Medications Ordered in UC Medications  ibuprofen (ADVIL) tablet 400 mg (400 mg Oral Given 06/12/23 1329)    Initial Impression / Assessment and  Plan / UC Course  I have reviewed the triage vital signs and the nursing notes.  Pertinent labs & imaging results that were available during my care of the patient were reviewed by me and considered in my medical decision making (see chart for details).     Test for flu and COVID is negative. Chest x-ray per my review shows increased infiltrate in the right lower lobe lobe compared to chest x-ray from January.  He is advised radiology overread  Levaquin is sent in for pneumonia and albuterol and prednisone were sent into treat what appears to be an asthma exacerbation. Final Clinical Impressions(s) / UC Diagnoses   Final diagnoses:  Acute cough  Community acquired pneumonia of right lower lobe of lung  Mild intermittent asthma with acute exacerbation     Discharge Instructions      On my review there is a pneumonia in your right lower lung.  The radiologist will also read your x-ray, and if their interpretation differs significantly from mine, and the management of your condition would change, we will call you.  Levaquin 750 mg--take 1 tablet daily for 7 days; this antibiotic.  Take prednisone 20 mg--2 daily for 5 days; this is for inflammation in your lungs.  Albuterol inhaler--do 2 puffs every 4 hours as needed for shortness of breath or wheezing  Take Phenergan with dextromethorphan syrup--5 mL or 1 teaspoon every 6 hours as needed for cough       ED Prescriptions     Medication Sig Dispense Auth. Provider   levofloxacin (LEVAQUIN) 750 MG tablet Take 1 tablet (750 mg total) by mouth daily. 7 tablet Kalise Fickett, Janace Aris, MD   predniSONE (DELTASONE) 20 MG tablet Take 2 tablets (40 mg total) by mouth daily with breakfast for 5 days. 10 tablet Zenia Resides, MD   promethazine-dextromethorphan (PROMETHAZINE-DM)  6.25-15 MG/5ML syrup Take 5 mLs by mouth 4 (four) times daily as needed for cough. 118 mL Zenia Resides, MD   albuterol (VENTOLIN HFA) 108 (90 Base) MCG/ACT inhaler Inhale 2 puffs into the lungs every 4 (four) hours as needed for wheezing or shortness of breath. 1 each Zenia Resides, MD      PDMP not reviewed this encounter.   Zenia Resides, MD 06/12/23 [phone]

## 2023-06-12 NOTE — ED Triage Notes (Signed)
 Pt c/o productive cough with green sputum, congestion, and chills since Tuesday. State taking dayquil and unsure if he did today.

## 2023-08-05 ENCOUNTER — Telehealth: Payer: Self-pay | Admitting: Cardiology

## 2023-08-05 MED ORDER — KLOR-CON M20 20 MEQ PO TBCR: 20.0000 meq | EXTENDED_RELEASE_TABLET | Freq: Every day | ORAL | 0 refills | Status: DC

## 2023-08-05 MED ORDER — TORSEMIDE 20 MG PO TABS: 20.0000 mg | ORAL_TABLET | Freq: Two times a day (BID) | ORAL | 0 refills | Status: DC

## 2023-08-05 NOTE — Telephone Encounter (Signed)
 Pt's medications were sent to pt's pharmacy as requested. Confirmation received.

## 2023-08-05 NOTE — Telephone Encounter (Signed)
*  STAT* If patient is at the pharmacy, call can be transferred to refill team.   1. Which medications need to be refilled? (please list name of each medication and dose if known) torsemide  (DEMADEX ) 20 MG tablet  KLOR-CON  M20 20 MEQ tablet    2. Would you like to learn more about the convenience, safety, & potential cost savings by using the [facility]?     3. Are you open to using the [facility] (Type [facility].  ).   4. Which pharmacy/location (including street and city if local pharmacy) is medication to be sent to? [id number] - [place], [place] - [address] AT [facility] [address]    5. Do they need a 30 day or 90 day supply? 90 day

## 2023-09-02 ENCOUNTER — Telehealth: Payer: Self-pay | Admitting: Cardiology

## 2023-09-02 ENCOUNTER — Telehealth: Payer: Self-pay | Admitting: Pharmacy Technician

## 2023-09-02 ENCOUNTER — Other Ambulatory Visit (HOSPITAL_COMMUNITY): Payer: Self-pay

## 2023-09-02 MED ORDER — METOPROLOL SUCCINATE ER 25 MG PO TB24: ORAL_TABLET | ORAL | 0 refills | Status: DC

## 2023-09-02 MED ORDER — TORSEMIDE 20 MG PO TABS: 20.0000 mg | ORAL_TABLET | Freq: Two times a day (BID) | ORAL | 0 refills | Status: DC

## 2023-09-02 MED ORDER — KLOR-CON M20 20 MEQ PO TBCR: 20.0000 meq | EXTENDED_RELEASE_TABLET | Freq: Every day | ORAL | 0 refills | Status: AC

## 2023-09-02 MED ORDER — DAPAGLIFLOZIN PROPANEDIOL 10 MG PO TABS: 10.0000 mg | ORAL_TABLET | Freq: Every day | ORAL | 0 refills | Status: DC

## 2023-09-02 NOTE — Telephone Encounter (Signed)
 Patient calling the office for samples of medication:   1.  What medication and dosage are you requesting samples for? Metoprolol , Farxiga , Klor-Con  and Torsemide   2.  Are you currently out of this medication? Yes- patient says if you have samples of any of these medicine, he would appreciate it. He lost his job and does not have insurance at  this time

## 2023-09-02 NOTE — Telephone Encounter (Signed)
 PAP: Patient assistance application for Farxiga  through [employer] ([employer]) has been mailed to pt's home address on file. Provider portion of application will be faxed to provider's office.  Provider portion will be uploaded to media

## 2023-09-02 NOTE — Telephone Encounter (Signed)
 Farxiga  10 mg by mouth daily sample is ready for a patient to pick at the front desk. The rest of the medication will be refilled until next office visit.

## 2023-09-05 ENCOUNTER — Other Ambulatory Visit: Payer: Self-pay | Admitting: Cardiology

## 2023-09-07 NOTE — Telephone Encounter (Signed)
Rx already filled by other means.

## 2023-10-15 ENCOUNTER — Telehealth: Payer: Self-pay | Admitting: Cardiology

## 2023-10-15 DIAGNOSIS — I5032 Chronic diastolic (congestive) heart failure: Secondary | ICD-10-CM

## 2023-10-15 MED ORDER — DAPAGLIFLOZIN PROPANEDIOL 10 MG PO TABS: 10.0000 mg | ORAL_TABLET | Freq: Every day | ORAL | 0 refills | Status: DC

## 2023-10-15 NOTE — Telephone Encounter (Signed)
*  STAT* If patient is at the pharmacy, call can be transferred to refill team.   1. Which medications need to be refilled? (please list name of each medication and dose if known)   dapagliflozin  propanediol (FARXIGA ) 10 MG TABS tablet   2. Would you like to learn more about the convenience, safety, & potential cost savings by using the [facility]?   3. Are you open to using the [facility] (Type [facility]. ).  4. Which pharmacy/location (including street and city if local pharmacy) is medication to be sent to?  [id number] - [place],  - [address] AT [facility] [address]   5. Do they need a 30 day or 90 day supply?   Patient stated he is completely out of this medication.  Patient has appointment scheduled with Dr. Pietro on 10/1.

## 2023-10-15 NOTE — Telephone Encounter (Signed)
 Refills has been sent to the pharmacy.

## 2023-10-19 NOTE — Telephone Encounter (Signed)
 Pt called in asking for this medication to be sent to the pharmacy, he is completely out

## 2023-10-20 MED ORDER — DAPAGLIFLOZIN PROPANEDIOL 10 MG PO TABS: 10.0000 mg | ORAL_TABLET | Freq: Every day | ORAL | 2 refills | Status: AC

## 2023-10-20 NOTE — Addendum Note (Signed)
 Addended by: DARRELL BRUCKNER on: 10/20/2023 10:39 AM   Modules accepted: Orders

## 2023-10-20 NOTE — Telephone Encounter (Signed)
 Rx sent to pharmacy

## 2023-11-01 ENCOUNTER — Other Ambulatory Visit: Payer: Self-pay

## 2023-11-01 ENCOUNTER — Encounter: Payer: Self-pay | Admitting: Emergency Medicine

## 2023-11-01 ENCOUNTER — Ambulatory Visit
Admission: EM | Admit: 2023-11-01 | Discharge: 2023-11-01 | Disposition: A | Discharge status: Home / Self Care | Payer: Self-pay | Attending: Family Medicine | Admitting: Family Medicine

## 2023-11-01 DIAGNOSIS — K219 Gastro-esophageal reflux disease without esophagitis: Secondary | ICD-10-CM

## 2023-11-01 MED ORDER — PANTOPRAZOLE SODIUM 40 MG PO TBEC: 40.0000 mg | DELAYED_RELEASE_TABLET | Freq: Every day | ORAL | 0 refills | Status: AC

## 2023-11-01 NOTE — ED Triage Notes (Addendum)
 Patient presents to Urgent Care with complaints of epigastric abdominal pain since 1 week ago. Patient reports feels like pressure pain in the epigastric region. Having pain when eating, vomited, pain. Pain with deep breathe. Denies no belching or gas. Having strong hiccups. Consistent with movement. No bad with sitting. Normal bowel movements. Same sensation with drinking as well. Has been taking Omeprazole for 3 days.

## 2023-11-01 NOTE — Discharge Instructions (Addendum)
 Advised patient to take medication as directed.  Advised patient to take medication first thing in the morning with 12 ounces of water and no other medications or foods for 1 hour to allow proton pump inhibitor take it take effect.  Advised if symptoms worsen and/or unresolved please follow-up with your primary care or go to [facility] ED for further evaluation.

## 2023-11-01 NOTE — ED Provider Notes (Signed)
 [name]    CSN: [id number] Arrival date & time: 11/01/23  1343      History   Chief Complaint Chief Complaint  Patient presents with   Abdominal Pain    Epigastric  region    HPI Zachary Fletcher is a 33 y.o. male.   HPI this 33 year old male presents with epigastric pain for 1 week.  Patient is accompanied by his wife today.  Patient reports having pain when eating or pain with deep breathing.  Reports taking omeprazole for 3 days.  PMH significant for acute on chronic diastolic heart failure, severe/morbid obesity, and sleep apnea, obstructive.  Past Medical History:  Diagnosis Date   Acute on chronic diastolic CHF (congestive heart failure) (HCC)    Left groin pain 11/19/2020   NSVT (nonsustained ventricular tachycardia) (HCC)    Sleep apnea, obstructive    Tachycardia     Patient Active Problem List   Diagnosis Date Noted   Varicose veins of both legs with edema 02/03/2023   SVT (supraventricular tachycardia) (HCC)    Elevated troponin    CHF (congestive heart failure) (HCC) 08/20/2021   Arthropathy, multiple sites 06/25/2021   Essential hypertension 06/25/2021   Neuroma of foot 06/25/2021   Neuropathy 11/19/2020   OSA on CPAP 11/19/2020   Prediabetes 11/19/2020   Morbid obesity due to excess calories (HCC) 04/10/2015    Past Surgical History:  Procedure Laterality Date   No prior surgery         Home Medications    Prior to Admission medications   Medication Sig Start Date End Date Taking? Authorizing Provider  KLOR-CON  M20 20 MEQ tablet Take 1 tablet (20 mEq total) by mouth daily. 09/02/23  Yes Pietro Redell RAMAN, MD  metoprolol  succinate (TOPROL -XL) 25 MG 24 hr tablet TAKE 1 TABLET EVERY MORNING AND 1/2 TABLET AT BEDTIME 09/02/23  Yes Pietro Redell RAMAN, MD  pantoprazole  (PROTONIX ) 40 MG tablet Take 1 tablet (40 mg total) by mouth daily. 11/01/23  Yes Teddy Sharper, FNP  torsemide  (DEMADEX ) 20 MG tablet Take 1 tablet (20 mg total) by mouth  2 (two) times daily. 09/02/23  Yes Pietro Redell RAMAN, MD  albuterol  (VENTOLIN  HFA) 108 (90 Base) MCG/ACT inhaler Inhale 2 puffs into the lungs every 4 (four) hours as needed for wheezing or shortness of breath. 06/12/23   Vonna Sharlet POUR, MD  dapagliflozin  propanediol (FARXIGA ) 10 MG TABS tablet Take 1 tablet (10 mg total) by mouth daily. 10/20/23   Pietro Redell RAMAN, MD  Vitamin D , Ergocalciferol , (DRISDOL ) 1.25 MG (50000 UNIT) CAPS capsule Take 1 capsule (50,000 Units total) by mouth every 7 (seven) days. 02/09/23   Nche, Roselie Rockford, NP    Family History Family History  Problem Relation Age of Onset   Sleep apnea Father    Diabetes Other     Social History Social History   Tobacco Use   Smoking status: Never   Smokeless tobacco: Never  Vaping Use   Vaping status: Never Used  Substance Use Topics   Alcohol use: Yes    Comment: Rare   Drug use: Never     Allergies   Patient has no known allergies.   Review of Systems Review of Systems   Physical Exam Triage Vital Signs ED Triage Vitals  Encounter Vitals Group     BP 11/01/23 1502 120/78     Girls Systolic BP Percentile --      Girls Diastolic BP Percentile --      Boys  Systolic BP Percentile --      Boys Diastolic BP Percentile --      Pulse Rate 11/01/23 1502 63     Resp 11/01/23 1502 18     Temp 11/01/23 1502 98.2 F (36.8 C)     Temp Source 11/01/23 1502 Oral     SpO2 11/01/23 1502 96 %     Weight --      Height --      Head Circumference --      Peak Flow --      Pain Score 11/01/23 1458 7     Pain Loc --      Pain Education --      Exclude from Growth Chart --    No data found.  Updated Vital Signs BP 120/78 (BP Location: Right Arm)   Pulse 63   Temp 98.2 F (36.8 C) (Oral)   Resp 18   SpO2 96%    Physical Exam Vitals and nursing note reviewed.  Constitutional:      Appearance: Normal appearance. He is obese.  HENT:     Head: Normocephalic and atraumatic.     Mouth/Throat:     Mouth:  Mucous membranes are moist.     Pharynx: Oropharynx is clear.  Eyes:     Extraocular Movements: Extraocular movements intact.     Pupils: Pupils are equal, round, and reactive to light.  Cardiovascular:     Rate and Rhythm: Normal rate and regular rhythm.     Pulses: Normal pulses.     Heart sounds: Normal heart sounds.  Pulmonary:     Effort: Pulmonary effort is normal.     Breath sounds: Normal breath sounds. No wheezing, rhonchi or rales.  Abdominal:     Comments: Patient reporting pain over epigastric region  Musculoskeletal:        General: Normal range of motion.     Cervical back: Normal range of motion and neck supple.  Skin:    General: Skin is warm and dry.  Neurological:     General: No focal deficit present.     Mental Status: He is alert and oriented to person, place, and time. Mental status is at baseline.      UC Treatments / Results  Labs (all labs ordered are listed, but only abnormal results are displayed) Labs Reviewed - No data to display  EKG   Radiology No results found.  Procedures Procedures (including critical care time)  Medications Ordered in UC Medications - No data to display  Initial Impression / Assessment and Plan / UC Course  I have reviewed the triage vital signs and the nursing notes.  Pertinent labs & imaging results that were available during my care of the patient were reviewed by me and considered in my medical decision making (see chart for details).      MDM: 1.  History of esophageal reflux disease, unspecified whether esophagitis is per-Rx'd Protonix  40 mg tablet: Take 1 tablet daily for the next 30 days. Advised patient to take medication as directed.  Advised patient to take medication first thing in the morning with 12 ounces of water and no other medications or foods for 1 hour to allow proton pump inhibitor take it take effect.  Advised if symptoms worsen and/or unresolved please follow-up with your primary care or go  to [facility] ED for further evaluation.  Patient discharged home, hemodynamically stable. Final Clinical Impressions(s) / UC Diagnoses   Final  diagnoses:  Gastroesophageal reflux disease, unspecified whether esophagitis present     Discharge Instructions      Advised patient to take medication as directed.  Advised patient to take medication first thing in the morning with 12 ounces of water and no other medications or foods for 1 hour to allow proton pump inhibitor take it take effect.  Advised if symptoms worsen and/or unresolved please follow-up with your primary care or go to [facility] ED for further evaluation.     ED Prescriptions     Medication Sig Dispense Auth. Provider   pantoprazole  (PROTONIX ) 40 MG tablet Take 1 tablet (40 mg total) by mouth daily. 30 tablet Finnley Larusso, FNP      PDMP not reviewed this encounter.   Teddy Sharper, FNP 11/01/23 1547

## 2023-11-16 ENCOUNTER — Telehealth: Payer: Self-pay | Admitting: Emergency Medicine

## 2023-11-16 ENCOUNTER — Encounter: Payer: Self-pay | Admitting: Nurse Practitioner

## 2023-11-16 NOTE — Telephone Encounter (Signed)
 Called patient in reference to his MyChart message. I informed patient that I was calling to get him scheduled for an appointment to be evaluated since he says the medication is not helping and has vomited. Patient stated that he is already scheduled to see GI within the [facility] at the end of October but if by chance what are the odds of him being seen sooner. Informed him that [facility]'s first available is 11/18/23 at 8:20 AM and that she has 2 colleagues that have open slots for tomorrow. He then stated that  he would rather go to see PCP at the [facility] office since it's closer to his home. I advised him that if he symptoms continue and/or worsen to go to nearest ED or return to the ED. He thanked me for calling and verbalized understanding all (if any) questions were answered.

## 2023-11-16 NOTE — Telephone Encounter (Signed)
 Patient left a message regarding a recommendation from Michael Ragan, NP for a GI doc.  Spoke with patient and advised that he would need to follow up with his PCP for further care.  Patient states it takes so long to get an appt with his PCP and he doesn't have insurance.  Recommended [facility], information given to patient.

## 2023-11-17 ENCOUNTER — Encounter: Payer: Self-pay | Admitting: Gastroenterology

## 2023-11-28 NOTE — Progress Notes (Signed)
 HPI: FU palpitations. Echocardiogram October 2021 showed normal LV function.  Monitor November 2021 showed sinus rhythm with 32nd run of SVT and 4 beats of nonsustained ventricular tachycardia.  Toprol  added.  Admitted with CHF symptoms June 2023.  Echocardiogram June 2023 showed normal LV function.  CTA June 2023 showed no pulmonary embolus.  Treated with diuretics with improvement.  Since last seen she denies dyspnea, exertional chest pain or syncope.  He does occasionally have pedal edema.  He is having difficulties with reflux per his report.  Current Outpatient Medications  Medication Sig Dispense Refill   KLOR-CON  M20 20 MEQ tablet Take 1 tablet (20 mEq total) by mouth daily. 90 tablet 0   metoprolol  succinate (TOPROL -XL) 25 MG 24 hr tablet TAKE 1 TABLET EVERY MORNING AND 1/2 TABLET AT BEDTIME 135 tablet 0   torsemide  (DEMADEX ) 20 MG tablet Take 1 tablet (20 mg total) by mouth 2 (two) times daily. 180 tablet 0   Vitamin D , Ergocalciferol , (DRISDOL ) 1.25 MG (50000 UNIT) CAPS capsule Take 1 capsule (50,000 Units total) by mouth every 7 (seven) days. 12 capsule 0   albuterol  (VENTOLIN  HFA) 108 (90 Base) MCG/ACT inhaler Inhale 2 puffs into the lungs every 4 (four) hours as needed for wheezing or shortness of breath. 1 each 0   dapagliflozin  propanediol (FARXIGA ) 10 MG TABS tablet Take 1 tablet (10 mg total) by mouth daily. (Patient not taking: Reported on 12/09/2023) 30 tablet 2   pantoprazole  (PROTONIX ) 40 MG tablet Take 1 tablet (40 mg total) by mouth daily. 30 tablet 0   No current facility-administered medications for this visit.     Past Medical History:  Diagnosis Date   Acute on chronic diastolic CHF (congestive heart failure) (HCC)    Left groin pain 11/19/2020   NSVT (nonsustained ventricular tachycardia) (HCC)    Sleep apnea, obstructive    Tachycardia     Past Surgical History:  Procedure Laterality Date   No prior surgery      Social History   Socioeconomic  History   Marital status: Married    Spouse name: Kepler Mccabe   Number of children: 0   Years of education: Not on file   Highest education level: Associate degree: occupational, Scientist, product/process development, or vocational program  Occupational History   Occupation: [employer]    Comment: Management consultant company  Tobacco Use   Smoking status: Never   Smokeless tobacco: Never  Vaping Use   Vaping status: Never Used  Substance and Sexual Activity   Alcohol use: Yes    Comment: Rare   Drug use: Never   Sexual activity: Yes  Other Topics Concern   Not on file  Social History Narrative   Right handed    Live with spouse    Social Drivers of Health   Financial Resource Strain: Low Risk  (08/22/2021)   Overall Financial Resource Strain (CARDIA)    Difficulty of Paying Living Expenses: Not hard at all  Food Insecurity: No Food Insecurity (08/22/2021)   Hunger Vital Sign    Worried About Running Out of Food in the Last Year: Never true    Ran Out of Food in the Last Year: Never true  Transportation Needs: No Transportation Needs (08/22/2021)   PRAPARE - Administrator, Civil Service (Medical): No    Lack of Transportation (Non-Medical): No  Physical Activity: Not on file  Stress: Not on file  Social Connections: Unknown (07/22/2021)   Received from [facility]  Social Network    Social Network: Not on file  Intimate Partner Violence: Unknown (06/13/2021)   Received from [facility]   HITS    Physically Hurt: Not on file    Insult or Talk Down To: Not on file    Threaten Physical Harm: Not on file    Scream or Curse: Not on file    Family History  Problem Relation Age of Onset   Sleep apnea Father    Diabetes Other     ROS: no fevers or chills, productive cough, hemoptysis, dysphasia, odynophagia, melena, hematochezia, dysuria, hematuria, rash, seizure activity, orthopnea, PND,  claudication. Remaining systems are negative.  Physical Exam: Well-developed obese in no acute distress.   Skin is warm and dry.  HEENT is normal.  Neck is supple.  Chest is clear to auscultation with normal expansion.  Cardiovascular exam is regular rate and rhythm.  Abdominal exam nontender or distended. No masses palpated. Extremities show 1+ edema; varicosities noted. neuro grossly intact  EKG Interpretation Date/Time:  Wednesday December 09 2023 09:15:24 EDT Ventricular Rate:  64 PR Interval:  188 QRS Duration:  114 QT Interval:  478 QTC Calculation: 493 R Axis:   22  Text Interpretation: Normal sinus rhythm Biatrial enlargement Minimal voltage criteria for LVH, may be normal variant ( R in aVL ) ST & T wave abnormality, consider lateral ischemia Confirmed by Pietro Rogue ([id number]) on 12/09/2023 9:19:03 AM    A/P  1 palpitations-patient denies any recent symptoms.  Continue beta-blocker.  2 chronic diastolic congestive heart failure-he is euvolemic today.  He could not afford Farxiga .  Continue torsemide  at present dose.  He can take an additional 20 mg daily as needed.  Check potassium and renal function.  We discussed fluid restriction and low-sodium diet.  3 obstructive sleep apnea-continue BiPAP.  4 morbid obesity-we discussed the importance of weight loss.  We also discussed GLP agonist but he does not have insurance and cannot afford.  Rogue Pietro, MD

## 2023-12-09 ENCOUNTER — Ambulatory Visit: Payer: Self-pay | Attending: Cardiology | Admitting: Cardiology

## 2023-12-09 ENCOUNTER — Encounter: Payer: Self-pay | Admitting: Cardiology

## 2023-12-09 VITALS — BP 136/85 | HR 64 | Ht 75.0 in

## 2023-12-09 DIAGNOSIS — I5032 Chronic diastolic (congestive) heart failure: Secondary | ICD-10-CM

## 2023-12-09 DIAGNOSIS — G4733 Obstructive sleep apnea (adult) (pediatric): Secondary | ICD-10-CM

## 2023-12-09 DIAGNOSIS — R002 Palpitations: Secondary | ICD-10-CM

## 2023-12-09 NOTE — Patient Instructions (Signed)

## 2023-12-10 ENCOUNTER — Ambulatory Visit: Payer: Self-pay | Admitting: Cardiology

## 2023-12-10 LAB — BASIC METABOLIC PANEL WITH GFR
BUN/Creatinine Ratio: 11 (ref 9–20)
BUN: 14 mg/dL (ref 6–20)
CO2: 25 mmol/L (ref 20–29)
Calcium: 9.2 mg/dL (ref 8.7–10.2)
Chloride: 102 mmol/L (ref 96–106)
Creatinine, Ser: 1.22 mg/dL (ref 0.76–1.27)
Glucose: 110 mg/dL — ABNORMAL HIGH (ref 70–99)
Potassium: 4.2 mmol/L (ref 3.5–5.2)
Sodium: 142 mmol/L (ref 134–144)
eGFR: 80 mL/min/1.73 (ref >59)

## 2023-12-14 ENCOUNTER — Telehealth: Payer: Self-pay | Admitting: Cardiology

## 2023-12-14 NOTE — Telephone Encounter (Signed)
 Please review and advise.

## 2023-12-14 NOTE — Telephone Encounter (Signed)
 Patient called stating at his last office visit they gave him a form to fill out for financial assistant for his doctor visits.  He was calling to see if it was approved.

## 2023-12-27 ENCOUNTER — Emergency Department (HOSPITAL_BASED_OUTPATIENT_CLINIC_OR_DEPARTMENT_OTHER): Payer: MEDICAID

## 2023-12-27 ENCOUNTER — Other Ambulatory Visit: Payer: Self-pay

## 2023-12-27 ENCOUNTER — Ambulatory Visit
Admission: EM | Admit: 2023-12-27 | Discharge: 2023-12-27 | Disposition: A | Discharge status: Other Acute Inpatient Hospital | Payer: Self-pay | Attending: Family Medicine | Admitting: Family Medicine

## 2023-12-27 ENCOUNTER — Encounter (HOSPITAL_BASED_OUTPATIENT_CLINIC_OR_DEPARTMENT_OTHER): Payer: Self-pay

## 2023-12-27 ENCOUNTER — Emergency Department (HOSPITAL_BASED_OUTPATIENT_CLINIC_OR_DEPARTMENT_OTHER)
Admission: EM | Admit: 2023-12-27 | Discharge: 2023-12-27 | Disposition: A | Discharge status: Home / Self Care | Payer: MEDICAID | Attending: Emergency Medicine | Admitting: Emergency Medicine

## 2023-12-27 DIAGNOSIS — I10 Essential (primary) hypertension: Secondary | ICD-10-CM | POA: Insufficient documentation

## 2023-12-27 DIAGNOSIS — Z79899 Other long term (current) drug therapy: Secondary | ICD-10-CM | POA: Insufficient documentation

## 2023-12-27 DIAGNOSIS — Z6841 Body Mass Index (BMI) 40.0 and over, adult: Secondary | ICD-10-CM | POA: Insufficient documentation

## 2023-12-27 DIAGNOSIS — L03116 Cellulitis of left lower limb: Secondary | ICD-10-CM | POA: Insufficient documentation

## 2023-12-27 DIAGNOSIS — R600 Localized edema: Secondary | ICD-10-CM | POA: Insufficient documentation

## 2023-12-27 DIAGNOSIS — S9032XA Contusion of left foot, initial encounter: Secondary | ICD-10-CM

## 2023-12-27 DIAGNOSIS — M79672 Pain in left foot: Secondary | ICD-10-CM

## 2023-12-27 DIAGNOSIS — L84 Corns and callosities: Secondary | ICD-10-CM

## 2023-12-27 HISTORY — DX: Vomiting, unspecified: R11.10

## 2023-12-27 LAB — URINALYSIS, W/ REFLEX TO CULTURE (INFECTION SUSPECTED)
Bacteria, UA: NONE SEEN
Bilirubin Urine: NEGATIVE
Glucose, UA: NEGATIVE mg/dL
Hgb urine dipstick: NEGATIVE
Ketones, ur: NEGATIVE mg/dL
Leukocytes,Ua: NEGATIVE
Nitrite: NEGATIVE
Protein, ur: 100 mg/dL — AB
Specific Gravity, Urine: 1.015 (ref 1.005–1.030)
Squamous Epithelial / HPF: NONE SEEN /HPF (ref 0–5)
WBC, UA: NONE SEEN WBC/hpf (ref 0–5)
pH: 7.5 (ref 5.0–8.0)

## 2023-12-27 LAB — CBC WITH DIFFERENTIAL/PLATELET
Abs Immature Granulocytes: 0.03 K/uL (ref 0.00–0.07)
Basophils Absolute: 0 K/uL (ref 0.0–0.1)
Basophils Relative: 0 %
Eosinophils Absolute: 0.1 K/uL (ref 0.0–0.5)
Eosinophils Relative: 1 %
HCT: 46.8 % (ref 39.0–52.0)
Hemoglobin: 15.5 g/dL (ref 13.0–17.0)
Immature Granulocytes: 0 %
Lymphocytes Relative: 18 %
Lymphs Abs: 1.5 K/uL (ref 0.7–4.0)
MCH: 29.3 pg (ref 26.0–34.0)
MCHC: 33.1 g/dL (ref 30.0–36.0)
MCV: 88.5 fL (ref 80.0–100.0)
Monocytes Absolute: 0.8 K/uL (ref 0.1–1.0)
Monocytes Relative: 9 %
Neutro Abs: 6.1 K/uL (ref 1.7–7.7)
Neutrophils Relative %: 72 %
Platelets: 169 K/uL (ref 150–400)
RBC: 5.29 MIL/uL (ref 4.22–5.81)
RDW: 13.8 % (ref 11.5–15.5)
WBC: 8.5 K/uL (ref 4.0–10.5)
nRBC: 0 % (ref 0.0–0.2)

## 2023-12-27 LAB — COMPREHENSIVE METABOLIC PANEL WITH GFR
ALT: 16 U/L (ref 0–44)
AST: 19 U/L (ref 15–41)
Albumin: 3.9 g/dL (ref 3.5–5.0)
Alkaline Phosphatase: 99 U/L (ref 38–126)
Anion gap: 11 (ref 5–15)
BUN: 14 mg/dL (ref 6–20)
CO2: 26 mmol/L (ref 22–32)
Calcium: 8.9 mg/dL (ref 8.9–10.3)
Chloride: 106 mmol/L (ref 98–111)
Creatinine, Ser: 1.03 mg/dL (ref 0.61–1.24)
GFR, Estimated: >60 mL/min (ref >60)
Glucose, Bld: 107 mg/dL — ABNORMAL HIGH (ref 70–99)
Potassium: 3.8 mmol/L (ref 3.5–5.1)
Sodium: 143 mmol/L (ref 135–145)
Total Bilirubin: 1 mg/dL (ref 0.0–1.2)
Total Protein: 6.6 g/dL (ref 6.5–8.1)

## 2023-12-27 LAB — LACTIC ACID, PLASMA: Lactic Acid, Venous: 1.4 mmol/L (ref 0.5–1.9)

## 2023-12-27 MED ORDER — SODIUM CHLORIDE 0.9 % IV SOLN
2.0000 g | Freq: Once | INTRAVENOUS | Status: AC
  Administered 2023-12-27: 2 g via INTRAVENOUS
  Filled 2023-12-27: qty 20

## 2023-12-27 MED ORDER — DOXYCYCLINE HYCLATE 100 MG PO CAPS: 100.0000 mg | ORAL_CAPSULE | Freq: Two times a day (BID) | ORAL | 0 refills | Status: AC

## 2023-12-27 MED ORDER — DOXYCYCLINE HYCLATE 100 MG PO CAPS: 100.0000 mg | ORAL_CAPSULE | Freq: Two times a day (BID) | ORAL | 0 refills | Status: DC

## 2023-12-27 NOTE — Discharge Instructions (Addendum)
 If this continues to worsen despite medications that have been provided for you today, please return to the emergency department for further evaluation.  Continue to take complete course of antibiotics prescribed, and follow-up with your primary care and with podiatry over the next week.

## 2023-12-27 NOTE — ED Provider Notes (Signed)
 [facility]    CSN: [id number] Arrival date & time: 12/27/23  1523      History   Chief Complaint No chief complaint on file.   HPI Zachary Fletcher is a 33 y.o. male.   HPI 33 year old male presents with.  PMH significant for acute on chronic diastolic heart failure, NSVT, and sleep apnea.  Past Medical History:  Diagnosis Date   Acute on chronic diastolic CHF (congestive heart failure) (HCC)    Left groin pain 11/19/2020   NSVT (nonsustained ventricular tachycardia) (HCC)    Sleep apnea, obstructive    Tachycardia     Patient Active Problem List   Diagnosis Date Noted   Varicose veins of both legs with edema 02/03/2023   SVT (supraventricular tachycardia)    Elevated troponin    CHF (congestive heart failure) (HCC) 08/20/2021   Arthropathy, multiple sites 06/25/2021   Essential hypertension 06/25/2021   Neuroma of foot 06/25/2021   Neuropathy 11/19/2020   OSA on CPAP 11/19/2020   Prediabetes 11/19/2020   Morbid obesity due to excess calories (HCC) 04/10/2015    Past Surgical History:  Procedure Laterality Date   No prior surgery         Home Medications    Prior to Admission medications   Medication Sig Start Date End Date Taking? Authorizing Provider  doxycycline (VIBRAMYCIN) 100 MG capsule Take 1 capsule (100 mg total) by mouth 2 (two) times daily for 10 days. 12/27/23 01/06/24 Yes Teddy Sharper, FNP  albuterol  (VENTOLIN  HFA) 108 (90 Base) MCG/ACT inhaler Inhale 2 puffs into the lungs every 4 (four) hours as needed for wheezing or shortness of breath. 06/12/23   Vonna Sharlet POUR, MD  dapagliflozin  propanediol (FARXIGA ) 10 MG TABS tablet Take 1 tablet (10 mg total) by mouth daily. Patient not taking: Reported on 12/09/2023 10/20/23   Pietro Redell RAMAN, MD  KLOR-CON  M20 20 MEQ tablet Take 1 tablet (20 mEq total) by mouth daily. 09/02/23   Pietro Redell RAMAN, MD  metoprolol  succinate (TOPROL -XL) 25 MG 24 hr tablet TAKE 1 TABLET EVERY MORNING AND  1/2 TABLET AT BEDTIME 09/02/23   Pietro Redell RAMAN, MD  pantoprazole  (PROTONIX ) 40 MG tablet Take 1 tablet (40 mg total) by mouth daily. 11/01/23   Teddy Sharper, FNP  torsemide  (DEMADEX ) 20 MG tablet Take 1 tablet (20 mg total) by mouth 2 (two) times daily. 09/02/23   Pietro Redell RAMAN, MD  Vitamin D , Ergocalciferol , (DRISDOL ) 1.25 MG (50000 UNIT) CAPS capsule Take 1 capsule (50,000 Units total) by mouth every 7 (seven) days. 02/09/23   Nche, Roselie Rockford, NP    Family History Family History  Problem Relation Age of Onset   Sleep apnea Father    Diabetes Other     Social History Social History   Tobacco Use   Smoking status: Never   Smokeless tobacco: Never  Vaping Use   Vaping status: Never Used  Substance Use Topics   Alcohol use: Yes    Comment: Rare   Drug use: Never     Allergies   Patient has no known allergies.   Review of Systems Review of Systems   Physical Exam Triage Vital Signs ED Triage Vitals  Encounter Vitals Group     BP      Girls Systolic BP Percentile      Girls Diastolic BP Percentile      Boys Systolic BP Percentile      Boys Diastolic BP Percentile      Pulse  Resp      Temp      Temp src      SpO2      Weight      Height      Head Circumference      Peak Flow      Pain Score      Pain Loc      Pain Education      Exclude from Growth Chart    No data found.  Updated Vital Signs BP (!) 176/95   Pulse 67   Temp 97.6 F (36.4 C)   Resp 19   SpO2 98%    Physical Exam Vitals and nursing note reviewed.  Constitutional:      General: He is not in acute distress.    Appearance: Normal appearance. He is obese. He is ill-appearing.  HENT:     Head: Normocephalic and atraumatic.     Mouth/Throat:     Mouth: Mucous membranes are moist.     Pharynx: Oropharynx is clear.  Eyes:     Extraocular Movements: Extraocular movements intact.     Conjunctiva/sclera: Conjunctivae normal.     Pupils: Pupils are equal, round, and  reactive to light.  Cardiovascular:     Pulses: Normal pulses.  Pulmonary:     Effort: Pulmonary effort is normal.     Breath sounds: Normal breath sounds. No wheezing or rales.  Musculoskeletal:        General: Normal range of motion.  Skin:    General: Skin is warm and dry.     Comments: Right lower leg/ankle/foot: Erythematous, necrotic area noted with moderate soft tissue swelling concerning for osteomyelitis and/or DVT  Neurological:     General: No focal deficit present.     Mental Status: He is alert and oriented to person, place, and time.  Psychiatric:        Mood and Affect: Mood normal.        Behavior: Behavior normal.      UC Treatments / Results  Labs (all labs ordered are listed, but only abnormal results are displayed) Labs Reviewed - No data to display  EKG   Radiology No results found.  Procedures Procedures (including critical care time)  Medications Ordered in UC Medications - No data to display  Initial Impression / Assessment and Plan / UC Course  I have reviewed the triage vital signs and the nursing notes.  Pertinent labs & imaging results that were available during my care of the patient were reviewed by me and considered in my medical decision making (see chart for details).     MDM: 1.  Cellulitis of left foot-Rx'd doxycycline 100 mg capsule: Take 1 capsule twice daily x 10 days. Advised patient to go to [facility] ED now for further evaluation of possible DVT, necrosis of left foot and cellulitis of left foot.  Advised patient take medication as directed with food to completion.  Encouraged to increase daily water intake to 64 ounces per day while taking this medication.  Patient agreed and verbalized understanding of these instructions and this plan of care today.  Discharged to ED Final Clinical Impressions(s) / UC Diagnoses   Final diagnoses:  Left foot pain  Cellulitis of left foot     Discharge Instructions       Advised patient to go to [facility] ED now for further evaluation of possible DVT, necrosis of left foot and cellulitis of  left foot.  Advised patient take medication as directed with food to completion.  Encouraged to increase daily water intake to 64 ounces per day while taking this medication.     ED Prescriptions     Medication Sig Dispense Auth. Provider   doxycycline (VIBRAMYCIN) 100 MG capsule Take 1 capsule (100 mg total) by mouth 2 (two) times daily for 10 days. 20 capsule Taaliyah Delpriore, FNP      PDMP not reviewed this encounter.   Teddy Sharper, FNP 12/27/23 [phone]

## 2023-12-27 NOTE — ED Notes (Signed)
 Pt has a half-dollar sized dark hematoma covered by a callus on his medial L heel. Not on the plantar face of the foot, but up on the side behind and below his ankle. Immediately surrounding the callus the pt has redness consistent with cellultic skin, that radiates up to his L shin. Taut skin and fluid retention as well, pt endorsed being on his feet for work for the last 2 months.

## 2023-12-27 NOTE — Discharge Instructions (Addendum)
 Advised patient to go to [facility] ED now for further evaluation of possible DVT, necrosis of left foot and cellulitis of left foot.  Advised patient take medication as directed with food to completion.  Encouraged to increase daily water intake to 64 ounces per day while taking this medication.

## 2023-12-27 NOTE — ED Notes (Signed)

## 2023-12-27 NOTE — ED Triage Notes (Signed)
 Pt reports that he went to the UC for left foot pain . States that he stepped on broach pin about 4 months ago.States that the about 5 days ago he was dealing with a calus that had formed and it began bleeding. States that it continues to bleed. Left  lower leg  noted to swollen and red. Pt states that left lower leg is tender. Denies fever.

## 2023-12-27 NOTE — ED Notes (Signed)
 Patient is being discharged from the Urgent Care and sent to the Emergency Department via pov . Per ragan NP, patient is in need of higher level of care due to concern for necrotic tissue of the foot/ blood clot and cellulitis . Patient is aware and verbalizes understanding of plan of care.  Vitals:   12/27/23 1539  BP: (!) 176/95  Pulse: 67  Resp: 19  Temp: 97.6 F (36.4 C)  SpO2: 98%

## 2023-12-27 NOTE — ED Triage Notes (Addendum)
 Pt presents to uc with co left heel abscess, pain, bleeding and discomfort for about 4 months. Pt reports he had a bad callouse that he had been shaving down and eventually he noticed it was cracking anf bleeding. It is now painful  with black silver dollar sized area on anterior heel with 1 +  pitting edema extending up the ankle and down the arch of the foot and redness extending up his ankle and down the arch of the foot.  Pt denies any pmh or

## 2023-12-27 NOTE — ED Provider Notes (Signed)
 [place] [facility] AT [facility] [place] Provider Note   CSN: [id number] Arrival date & time: 12/27/23  1654     Patient presents with: Foot Pain   Zachary Fletcher is a 33 y.o. male referred to the ED by urgent care over concern for a wound on the patient's left heel.  He states that approximately 4 months ago he stepped on a pen that caused a puncture wound to the same area, has been dealing with a chronic callus to that area but recently has started to have bleeding that is associated with the callus.  He endorses that he has been persistently shaving the area down, has not followed with wound care or podiatry for same.  He also has increasing erythema and tenderness up into the mid calf of the associated leg.  Referred by urgent care out of concern of necrotic lesion and need for intravenous antibiotics.  Patient denies having any body aches, fevers, chills, or any other systemic symptoms.  Does have increased pain on the left leg with weightbearing.  Review of this patient's medical history does show that they have diagnoses of prediabetes, congestive heart failure, supraventricular tachycardia, essential hypertension, and morbid obesity.    Foot Pain       Prior to Admission medications   Medication Sig Start Date End Date Taking? Authorizing Provider  doxycycline (VIBRAMYCIN) 100 MG capsule Take 1 capsule (100 mg total) by mouth 2 (two) times daily. 12/27/23  Yes Myriam Dorn BROCKS, PA  albuterol  (VENTOLIN  HFA) 108 (90 Base) MCG/ACT inhaler Inhale 2 puffs into the lungs every 4 (four) hours as needed for wheezing or shortness of breath. 06/12/23   Vonna Sharlet POUR, MD  dapagliflozin  propanediol (FARXIGA ) 10 MG TABS tablet Take 1 tablet (10 mg total) by mouth daily. Patient not taking: Reported on 12/09/2023 10/20/23   Pietro Redell RAMAN, MD  KLOR-CON  M20 20 MEQ tablet Take 1 tablet (20 mEq total) by mouth daily. 09/02/23   Pietro Redell RAMAN, MD  metoprolol  succinate  (TOPROL -XL) 25 MG 24 hr tablet TAKE 1 TABLET EVERY MORNING AND 1/2 TABLET AT BEDTIME 09/02/23   Pietro Redell RAMAN, MD  pantoprazole  (PROTONIX ) 40 MG tablet Take 1 tablet (40 mg total) by mouth daily. 11/01/23   Teddy Sharper, FNP  torsemide  (DEMADEX ) 20 MG tablet Take 1 tablet (20 mg total) by mouth 2 (two) times daily. 09/02/23   Pietro Redell RAMAN, MD  Vitamin D , Ergocalciferol , (DRISDOL ) 1.25 MG (50000 UNIT) CAPS capsule Take 1 capsule (50,000 Units total) by mouth every 7 (seven) days. 02/09/23   Nche, Roselie Rockford, NP    Allergies: Patient has no known allergies.    Review of Systems  Musculoskeletal:  Positive for myalgias.  Skin:  Positive for wound.  All other systems reviewed and are negative.   Updated Vital Signs BP (!) 142/76 (BP Location: Right Arm)   Pulse 64   Temp 98.1 F (36.7 C)   Resp 20   Ht 6' 3 (1.905 m)   Wt (!) 204.1 kg   SpO2 99%   BMI 56.25 kg/m   Physical Exam Vitals and nursing note reviewed.  Constitutional:      General: He is awake. He is not in acute distress.    Appearance: Normal appearance. He is well-developed and well-groomed. He is morbidly obese. He is not ill-appearing or toxic-appearing.  HENT:     Head: Normocephalic and atraumatic.     Mouth/Throat:     Mouth: Mucous membranes are  moist.     Pharynx: Oropharynx is clear.  Eyes:     Extraocular Movements: Extraocular movements intact.     Conjunctiva/sclera: Conjunctivae normal.     Pupils: Pupils are equal, round, and reactive to light.  Cardiovascular:     Rate and Rhythm: Normal rate and regular rhythm.     Pulses: Normal pulses.          Dorsalis pedis pulses are 2+ on the right side and 2+ on the left side.       Posterior tibial pulses are 2+ on the right side and 2+ on the left side.     Heart sounds: Normal heart sounds, S1 normal and S2 normal. No murmur heard.    No friction rub. No gallop.  Pulmonary:     Effort: Pulmonary effort is normal.     Breath sounds: Normal  breath sounds and air entry.  Abdominal:     General: Abdomen is flat. Bowel sounds are normal.     Palpations: Abdomen is soft.  Musculoskeletal:        General: Normal range of motion.     Cervical back: Normal range of motion and neck supple.     Right lower leg: 2+ Pitting Edema present.     Left lower leg: 2+ Pitting Edema present.  Feet:     Left foot:     Skin integrity: Erythema, warmth and callus present.     Comments: Callus with underlying hematoma appreciated to the left heel on the medial aspect. Skin:    General: Skin is warm and dry.     Capillary Refill: Capillary refill takes less than 2 seconds.     Findings: Erythema present.     Comments: Increased erythema and tenderness extending up to the mid calf on the left lower extremity.  Neurological:     General: No focal deficit present.     Mental Status: He is alert. Mental status is at baseline.  Psychiatric:        Mood and Affect: Mood normal.        Behavior: Behavior is cooperative.     (all labs ordered are listed, but only abnormal results are displayed) Labs Reviewed  COMPREHENSIVE METABOLIC PANEL WITH GFR - Abnormal; Notable for the following components:      Result Value   Glucose, Bld 107 (*)    All other components within normal limits  CULTURE, BLOOD (ROUTINE X 2)  CULTURE, BLOOD (ROUTINE X 2)  LACTIC ACID, PLASMA  CBC WITH DIFFERENTIAL/PLATELET  URINALYSIS, W/ REFLEX TO CULTURE (INFECTION SUSPECTED)    EKG: None  Radiology: DG Ankle Complete Left Result Date: 12/27/2023 CLINICAL DATA:  Wound and swelling on heel. Concern for deep tissue infection. EXAM: LEFT ANKLE COMPLETE - 3+ VIEW; LEFT FOOT - COMPLETE 3+ VIEW COMPARISON:  None Available. FINDINGS: Left foot: There is no evidence of acute fracture or dislocation. No periosteal elevation or bony erosion is seen. Diffuse soft tissue swelling is noted. Left ankle: There is no evidence of acute fracture or dislocation. No periosteal elevation  or bony erosion is seen. Joint space is maintained. Diffuse soft tissue swelling is present. A tiny defect is noted along the posterior aspect of the heel, possibly representing known wound. IMPRESSION: No acute osseous abnormality or radiographic evidence of osteomyelitis. Electronically Signed   By: Leita Birmingham M.D.   On: 12/27/2023 18:51   DG Foot Complete Left Result Date: 12/27/2023 CLINICAL DATA:  Wound and swelling on heel.  Concern for deep tissue infection. EXAM: LEFT ANKLE COMPLETE - 3+ VIEW; LEFT FOOT - COMPLETE 3+ VIEW COMPARISON:  None Available. FINDINGS: Left foot: There is no evidence of acute fracture or dislocation. No periosteal elevation or bony erosion is seen. Diffuse soft tissue swelling is noted. Left ankle: There is no evidence of acute fracture or dislocation. No periosteal elevation or bony erosion is seen. Joint space is maintained. Diffuse soft tissue swelling is present. A tiny defect is noted along the posterior aspect of the heel, possibly representing known wound. IMPRESSION: No acute osseous abnormality or radiographic evidence of osteomyelitis. Electronically Signed   By: Leita Birmingham M.D.   On: 12/27/2023 18:51   DG Chest 2 View Result Date: 12/27/2023 CLINICAL DATA:  Cellulitis. EXAM: CHEST - 2 VIEW COMPARISON:  Chest related 06/12/2023. FINDINGS: Stable cardiomegaly with mild central vascular congestion. No focal consolidation, pleural effusion, or pneumothorax. No acute osseous pathology. IMPRESSION: Cardiomegaly with mild central vascular congestion. No focal consolidation. Electronically Signed   By: Vanetta Chou M.D.   On: 12/27/2023 18:16     Procedures   Medications Ordered in the ED  cefTRIAXone (ROCEPHIN) 2 g in sodium chloride  0.9 % 100 mL IVPB (2 g Intravenous New Bag/Given 12/27/23 1931)                                    Medical Decision Making Amount and/or Complexity of Data Reviewed Labs: ordered. Radiology:  ordered.  Risk Prescription drug management.   Medical Decision Making:   Elisandro Jarrett is a 33 y.o. male who presented to the ED today with wound to the left heel with associated erythema and tenderness to the leg detailed above.     Complete initial physical exam performed, notably the patient  was alert oriented no apparent distress.  Physical exam findings as noted..    Reviewed and confirmed nursing documentation for past medical history, family history, social history.    Initial Assessment:   With the patient's presentation of wound to the medial left heel, along with surrounding erythema, most likely diagnosis is cellulitis of the left heel with associated hematoma..  Further consider potential osteomyelitis, gas gangrene, necrotizing fasciitis.  Initial Plan:  Obtain plain film imaging of the left ankle and foot to assess for presence of gas pockets and or deep tissue infection or signs of osteomyelitis. Screening labs including CBC and Metabolic panel to evaluate for infectious or metabolic etiology of disease.  Obtain serum lactate as well as blood cultures to evaluate for bacteremia. Urinalysis with reflex culture ordered to evaluate for UTI or relevant urologic/nephrologic pathology.  Ordered by nursing triage. CXR was ordered at nursing triage, unspecified as to why as patient does not have a pulmonary complaint at this time. Objective evaluation as below reviewed   Initial Study Results:   Laboratory  All laboratory results reviewed without evidence of clinically relevant pathology.   Exceptions include: None    Radiology:  All images reviewed independently. Agree with radiology report at this time.   DG Ankle Complete Left Result Date: 12/27/2023 CLINICAL DATA:  Wound and swelling on heel. Concern for deep tissue infection. EXAM: LEFT ANKLE COMPLETE - 3+ VIEW; LEFT FOOT - COMPLETE 3+ VIEW COMPARISON:  None Available. FINDINGS: Left foot: There is no evidence of acute  fracture or dislocation. No periosteal elevation or bony erosion is seen. Diffuse soft tissue swelling is noted. Left ankle:  There is no evidence of acute fracture or dislocation. No periosteal elevation or bony erosion is seen. Joint space is maintained. Diffuse soft tissue swelling is present. A tiny defect is noted along the posterior aspect of the heel, possibly representing known wound. IMPRESSION: No acute osseous abnormality or radiographic evidence of osteomyelitis. Electronically Signed   By: Leita Birmingham M.D.   On: 12/27/2023 18:51   DG Foot Complete Left Result Date: 12/27/2023 CLINICAL DATA:  Wound and swelling on heel. Concern for deep tissue infection. EXAM: LEFT ANKLE COMPLETE - 3+ VIEW; LEFT FOOT - COMPLETE 3+ VIEW COMPARISON:  None Available. FINDINGS: Left foot: There is no evidence of acute fracture or dislocation. No periosteal elevation or bony erosion is seen. Diffuse soft tissue swelling is noted. Left ankle: There is no evidence of acute fracture or dislocation. No periosteal elevation or bony erosion is seen. Joint space is maintained. Diffuse soft tissue swelling is present. A tiny defect is noted along the posterior aspect of the heel, possibly representing known wound. IMPRESSION: No acute osseous abnormality or radiographic evidence of osteomyelitis. Electronically Signed   By: Leita Birmingham M.D.   On: 12/27/2023 18:51   DG Chest 2 View Result Date: 12/27/2023 CLINICAL DATA:  Cellulitis. EXAM: CHEST - 2 VIEW COMPARISON:  Chest related 06/12/2023. FINDINGS: Stable cardiomegaly with mild central vascular congestion. No focal consolidation, pleural effusion, or pneumothorax. No acute osseous pathology. IMPRESSION: Cardiomegaly with mild central vascular congestion. No focal consolidation. Electronically Signed   By: Vanetta Chou M.D.   On: 12/27/2023 18:16      Reassessment and Plan:   Given this patient's exam findings, we will provide him with a dose of ceftriaxone and  then plan for discharge to podiatry for follow-up on the wound on his heel.  Will also provide him with a course of doxycycline to be taken in the outpatient setting.  Encouraged follow-up with primary care.  Imaging does not show any signs of osteomyelitis or any deep tissue space infection.  There is no leukocytosis nor is there any other concerning signs or symptoms for septicemia as these vital signs are stable within normal limits.  Lactate is not elevated.  Given the reassuring workup and exam findings, at this time we will discharge patient for outpatient follow-up to podiatry, careful return precautions have been given, they understand and agree of no further concerns at this time.       Final diagnoses:  Cellulitis of left lower extremity    ED Discharge Orders          Ordered    doxycycline (VIBRAMYCIN) 100 MG capsule  2 times daily        12/27/23 1922               Myriam Dorn BROCKS, [place] 12/27/23 1942    Cottie Donnice PARAS, MD 12/27/23 2157

## 2023-12-27 NOTE — ED Notes (Signed)
 XR at bedside

## 2023-12-28 ENCOUNTER — Telehealth: Payer: Self-pay

## 2023-12-28 NOTE — Telephone Encounter (Signed)
 Called to check on patient. He went to ER as advised yesterday. No needs.

## 2024-01-01 LAB — CULTURE, BLOOD (ROUTINE X 2)
Culture: NO GROWTH
Culture: NO GROWTH
Special Requests: ADEQUATE
Special Requests: ADEQUATE

## 2024-01-07 ENCOUNTER — Ambulatory Visit: Payer: Self-pay | Admitting: Gastroenterology

## 2024-02-08 ENCOUNTER — Other Ambulatory Visit: Payer: Self-pay | Admitting: Cardiology

## 2024-02-09 ENCOUNTER — Other Ambulatory Visit: Payer: Self-pay | Admitting: Cardiology
